# Patient Record
Sex: Male | Born: 1970 | Race: White | Hispanic: No | Marital: Married | Smoking: Never smoker
Health system: Southern US, Community
[De-identification: ages and names within clinical notes are randomized; demographics above are authoritative.]

## PROBLEM LIST (undated history)

## (undated) DIAGNOSIS — Z8249 Family history of ischemic heart disease and other diseases of the circulatory system: Secondary | ICD-10-CM

## (undated) DIAGNOSIS — E785 Hyperlipidemia, unspecified: Secondary | ICD-10-CM

## (undated) DIAGNOSIS — Z87898 Personal history of other specified conditions: Secondary | ICD-10-CM

## (undated) DIAGNOSIS — G935 Compression of brain: Secondary | ICD-10-CM

## (undated) HISTORY — DX: Personal history of other specified conditions: Z87.898

## (undated) HISTORY — DX: Compression of brain: G93.5

## (undated) HISTORY — DX: Family history of ischemic heart disease and other diseases of the circulatory system: Z82.49

## (undated) HISTORY — DX: Hyperlipidemia, unspecified: E78.5

---

## 1995-07-08 HISTORY — PX: OTHER SURGICAL HISTORY: SHX169

## 1997-07-07 HISTORY — PX: HERNIA REPAIR: SHX51

## 2012-11-04 DIAGNOSIS — Z87898 Personal history of other specified conditions: Secondary | ICD-10-CM

## 2012-11-04 HISTORY — DX: Personal history of other specified conditions: Z87.898

## 2012-11-19 ENCOUNTER — Telehealth (HOSPITAL_COMMUNITY): Payer: Self-pay | Admitting: Internal Medicine

## 2012-11-19 NOTE — Telephone Encounter (Signed)
Called patient and left message to notify of ins. Denial and Dr. Blanchie Dessert decision to change to MET. Also notified that a scheduler would be in contact to schedule MET. [name] was notified.  [name]  ----- Message ----- From: [name], MD Sent: 11/19/2012 1:31 PM  To: [name], [facility] Pool Subject: RE: Cardiac CTA Alexander [name] 11/07/1970   [name]- Please let the patient know that the insurance company has denied it and instead recommended higher risk, invasive coronary angiography which I don't think he needs. I would be happy with a MET-TEST. If that were low risk, then it is associated with a low risk of cardiovascular events and it can give [place] good information about general cardiovascular condition. Please share this with him and discuss with the nursing staff or [name] to schedule a MET-TEST (no PFT's) - diagnosis, Chest pain. Thanks. -[place]   ----- Message ----- From: [name] Sent: 11/19/2012 10:49 AM To: [name], MD Subject: Cardiac CTA [name] Sep 14, 1970   Dr. Rennis Golden, As you know, we have had difficulty getting [name]'s cardiac CTA approved with [facility]. Unfortunately, you are unable to proceed with a typical Peer Review and to this I admit fault. When I previously spoke with [employer] they informed me that they did not receive the office notes and EKG I sent via fax. They stated that I may resubmit this information through a process called 'Restart'. Unfortunately I was unaware that this process was the last step available in the form of a Peer Review. Ultimately, upon reviewing the re-faxed information, [employer] upheld their denial decision on the basis that prior testing in the form of a conventional angiography or regular stress test had not been performed. Our options moving forward are as follows: -wait 60 days to request the cardiac CTA again and go through the provider phone review process -recommend alternative testing to evaluate the  patient's indication -the patient may request an appeal in writing through [facility]. This may include a new dictation that you wish to relay. With this option, an appeal determination will be made by [employer] within 30 days I previously notified the patient of the insurance issues we have encountered and I plan to notify him regarding this additional information following your advisement. Thank you and I apologize for this troublesome matter. [name]

## 2012-12-20 ENCOUNTER — Encounter (INDEPENDENT_AMBULATORY_CARE_PROVIDER_SITE_OTHER): Payer: BC Managed Care – PPO

## 2012-12-20 DIAGNOSIS — R0602 Shortness of breath: Secondary | ICD-10-CM

## 2012-12-23 ENCOUNTER — Ambulatory Visit (INDEPENDENT_AMBULATORY_CARE_PROVIDER_SITE_OTHER): Payer: BC Managed Care – PPO | Admitting: Internal Medicine

## 2012-12-23 ENCOUNTER — Encounter: Payer: Self-pay | Admitting: Internal Medicine

## 2012-12-23 VITALS — BP 128/84 | HR 72 | Ht 74.0 in | Wt 183.1 lb

## 2012-12-23 DIAGNOSIS — Z8249 Family history of ischemic heart disease and other diseases of the circulatory system: Secondary | ICD-10-CM

## 2012-12-23 DIAGNOSIS — E785 Hyperlipidemia, unspecified: Secondary | ICD-10-CM

## 2012-12-23 DIAGNOSIS — R0789 Other chest pain: Secondary | ICD-10-CM

## 2012-12-23 NOTE — Patient Instructions (Signed)
Your physician recommends that you schedule a follow-up appointment in: AS NEEDED  

## 2012-12-24 ENCOUNTER — Encounter: Payer: Self-pay | Admitting: Internal Medicine

## 2012-12-24 DIAGNOSIS — Z8249 Family history of ischemic heart disease and other diseases of the circulatory system: Secondary | ICD-10-CM | POA: Insufficient documentation

## 2012-12-24 DIAGNOSIS — E785 Hyperlipidemia, unspecified: Secondary | ICD-10-CM | POA: Insufficient documentation

## 2012-12-24 DIAGNOSIS — R0789 Other chest pain: Secondary | ICD-10-CM | POA: Insufficient documentation

## 2012-12-24 NOTE — Progress Notes (Signed)
OFFICE NOTE  Chief Complaint:  Followup MET-TEST  Primary Care Physician: Ezequiel Kayser, MD  HPI:  Alexander [name] is a 42 year old gentleman who has had complaints of intermittent left chest and left arm pain over the last 2 years. He feels sometimes an anxious feeling in his chest, mostly during rest. Also feels some numbness and tingling in his left arm he notes most when keeping his hand outstretched like on a desk or something of that sort. Sometimes the feeling is somewhat of a burning-type sensation, again more in the left axillary area or sometimes up in the left shoulder and the left arm. There is no substernal location of this and it does not seem to occur during exercise. It is not relieved by rest. In fact, he is quite active, does a lot of exercise, a little bit of elliptical machines, pushups and sit-ups and is really not symptomatic during that exercise. He does, however, have a strong family history of heart disease. In fact, his father died of sudden cardiac death at age 19. He also I understand was recently diagnosed with dyslipidemia. I reviewed his lipoprotein panel as well as his traditional LDL content which demonstrated a high total cholesterol of 231, triglycerides 92, HDL of 40, LDL of 173. His LDL/HDL ratio was 4.3, and according to his total LipoScience profile, his total LDL particle number was 1530, HDL particle number 28, small LDL-P was 339. His LP-IR score was 34. He mentioned you had recommended that this is a genetic pattern of dyslipidemia which I agree and that he may benefit from cholesterol medication; however, he does report less than ideal diet and wants to at least try to work on that some before considering medication. Based on his risk factors, I recommended a cardiac CT today weight coronary calcium and plaque. Unfortunately, I cannot convince the insurance company that this is necessary. Therefore he underwent an alternative test which was metabolic testing.  His midchest reveals good effort with a mildly reduced PO2 in the upper 80s. There was a slight ischemic response, suggesting possible small vessel coronary disease. However the study was considered low risk.  PMHx:  Past Medical History  Diagnosis Date  . Hyperlipidemia   . H/O chest pain May 2014    met test    Past Surgical History  Procedure Laterality Date  . Deviated septal surgery  1997  . Hernia repair  1999    FAMHx:  History reviewed. No pertinent family history.  SOCHx:   reports that he has never smoked. He has quit using smokeless tobacco. His smokeless tobacco use included Snuff. He reports that he does not drink alcohol or use illicit drugs.  ALLERGIES:  No Known Allergies  ROS: A comprehensive review of systems was negative except for: Cardiovascular: positive for dyspnea  HOME MEDS: Current Outpatient Prescriptions  Medication Sig Dispense Refill  . aspirin 81 MG tablet Take 81 mg by mouth daily.      . multivitamin (ONE-A-DAY MEN'S) TABS Take 1 tablet by mouth daily.      . Omega-3 Fatty Acids (FISH OIL PO) Take 1,500 mg by mouth daily.       No current facility-administered medications for this visit.    LABS/IMAGING: No results found for this or any previous visit (from the past 48 hour(s)). No results found.  VITALS: BP 128/84  Pulse 72  Ht 6\' 2"  (1.88 m)  Wt 183 lb 1.6 oz (83.054 kg)  BMI 23.5 kg/m2  EXAM: deferred  EKG: deferred  ASSESSMENT: 1. Low risk cardiometabolic testing 2. Dyslipidemia 3. Family history of coronary artery disease  PLAN: 1.   Alexander [name] has an mildly abnormal cardiometabolic test, however the VO2 is minimally reduced.  This could represent small vessel ischemia. I recommended starting an exercise program along with dietary changes which we discussed at length in the office. I also think despite his changes in diet, he would benefit from statin medication, which can improve microvascular coronary flow. It  would be reasonable to consider repeat metabolic testing in one year. Again it was a pleasure seeing Alexander [name] today. Thank you for the kind referral.    [name], MD, [facility] Attending Cardiologist [facility]  Andreas Sobolewski C 12/24/2012, 8:07 AM

## 2013-04-05 ENCOUNTER — Other Ambulatory Visit (HOSPITAL_COMMUNITY): Payer: Self-pay | Admitting: Internal Medicine

## 2013-04-05 DIAGNOSIS — R0789 Other chest pain: Secondary | ICD-10-CM

## 2013-04-05 DIAGNOSIS — R0602 Shortness of breath: Secondary | ICD-10-CM

## 2013-04-08 ENCOUNTER — Ambulatory Visit (HOSPITAL_COMMUNITY): Payer: BC Managed Care – PPO

## 2013-04-11 ENCOUNTER — Encounter: Payer: Self-pay | Admitting: Internal Medicine

## 2013-04-11 ENCOUNTER — Ambulatory Visit (INDEPENDENT_AMBULATORY_CARE_PROVIDER_SITE_OTHER): Payer: BC Managed Care – PPO | Admitting: Internal Medicine

## 2013-04-11 ENCOUNTER — Ambulatory Visit (HOSPITAL_COMMUNITY): Payer: BC Managed Care – PPO

## 2013-04-11 ENCOUNTER — Other Ambulatory Visit: Payer: Self-pay | Admitting: Internal Medicine

## 2013-04-11 VITALS — BP 110/86 | HR 75 | Ht 74.0 in | Wt 185.2 lb

## 2013-04-11 DIAGNOSIS — Z8249 Family history of ischemic heart disease and other diseases of the circulatory system: Secondary | ICD-10-CM

## 2013-04-11 DIAGNOSIS — R0789 Other chest pain: Secondary | ICD-10-CM

## 2013-04-11 DIAGNOSIS — K219 Gastro-esophageal reflux disease without esophagitis: Secondary | ICD-10-CM | POA: Insufficient documentation

## 2013-04-11 DIAGNOSIS — E785 Hyperlipidemia, unspecified: Secondary | ICD-10-CM

## 2013-04-11 NOTE — Progress Notes (Signed)
OFFICE NOTE  Chief Complaint:  Followup MET-TEST  Primary Care Physician: Alexander Kayser, MD  HPI:  Alexander [name] is a 42 year old gentleman who has had complaints of intermittent left chest and left arm pain over the last 2 years. He feels sometimes an anxious feeling in his chest, mostly during rest. Also feels some numbness and tingling in his left arm he notes most when keeping his hand outstretched like on a desk or something of that sort. Sometimes the feeling is somewhat of a burning-type sensation, again more in the left axillary area or sometimes up in the left shoulder and the left arm. There is no substernal location of this and it does not seem to occur during exercise. It is not relieved by rest. In fact, he is quite active, does a lot of exercise, a little bit of elliptical machines, pushups and sit-ups and is really not symptomatic during that exercise. He does, however, have a strong family history of heart disease. In fact, his father died of sudden cardiac death at age 59. He also I understand was recently diagnosed with dyslipidemia. I reviewed his lipoprotein panel as well as his traditional LDL content which demonstrated a high total cholesterol of 231, triglycerides 92, HDL of 40, LDL of 173. His LDL/HDL ratio was 4.3, and according to his total LipoScience profile, his total LDL particle number was 1530, HDL particle number 28, small LDL-P was 339. His LP-IR score was 34. He mentioned you had recommended that this is a genetic pattern of dyslipidemia which I agree and that he may benefit from cholesterol medication; however, he does report less than ideal diet and wants to at least try to work on that some before considering medication. Based on his risk factors, I recommended a cardiac CT today weight coronary calcium and plaque. Unfortunately, I cannot convince the insurance company that this is necessary. Therefore he underwent an alternative test which was metabolic  testing. His midchest reveals good effort with a mildly reduced PO2 in the upper 80s. There was a slight ischemic response, suggesting possible small vessel coronary disease. However the study was considered low risk.  Alexander [name] continues to have symptoms but feels more like a burning in his left arm left shoulder and upper back. The symptoms are somewhat worse at night when he is laying down trying to go to sleep. He does report that he's been fairly busy at work and eats late at night. He also feels sometimes like he needs to loosen his belt because of some tightness in the abdomen. He does not report any worsening flatus or eructation more than normal. His symptoms are not worse when exerting himself, mowing the lawn or other activities. He recently saw his primary care provider who recommended a CT angiogram however this was denied by the insurance company.  PMHx:  Past Medical History  Diagnosis Date  . Hyperlipidemia   . H/O chest pain May 2014    met test    Past Surgical History  Procedure Laterality Date  . Deviated septal surgery  1997  . Hernia repair  1999    FAMHx:  History reviewed. No pertinent family history.  SOCHx:   reports that he has never smoked. He has quit using smokeless tobacco. His smokeless tobacco use included Snuff. He reports that he does not drink alcohol or use illicit drugs.  ALLERGIES:  No Known Allergies  ROS: A comprehensive review of systems was negative except for: Cardiovascular: positive for  dyspnea Gastrointestinal: positive for reflux symptoms  HOME MEDS: Current Outpatient Prescriptions  Medication Sig Dispense Refill  . aspirin 81 MG tablet Take 81 mg by mouth daily.      . Coenzyme Q10 (COQ10) 200 MG CAPS Take 1 tablet by mouth daily.      . multivitamin (ONE-A-DAY MEN'S) TABS Take 1 tablet by mouth daily.      . Omega-3 Fatty Acids (FISH OIL PO) Take 1,500 mg by mouth daily.      Marland Kitchen atorvastatin (LIPITOR) 40 MG tablet Take 1  tablet by mouth daily.       No current facility-administered medications for this visit.    LABS/IMAGING: No results found for this or any previous visit (from the past 48 hour(s)). No results found.  VITALS: BP 110/86  Pulse 75  Ht 6\' 2"  (1.88 m)  Wt 185 lb 3.2 oz (84.006 kg)  BMI 23.77 kg/m2  EXAM: General appearance: alert and no distress Lungs: clear to auscultation bilaterally Heart: regular rate and rhythm, S1, S2 normal, no murmur, click, rub or gallop Abdomen: soft, non-tender; bowel sounds normal; no masses,  no organomegaly  EKG: Normal sinus rhythm at 75  ASSESSMENT: 1. Low risk cardiometabolic testing - with mild ischemic response 2. Ongoing chest, arm and upper back burning pain - suspicious for GERD 3. Dyslipidemia - now on statin 4. Family history of premature coronary artery disease  PLAN: 1.   Alexander [name] has returned with recurrent symptoms of burning left chest arm and upper back pain. His EKG shows no signs of ischemia at rest. Symptoms are somewhat worse after eating or laying down. He frequently eats late at night after long days at work and feels somewhat short of breath. He also feels that his nose is congested but he does have a history of a deviated nasal septum. At times he feels some pressure in his abdomen like he needs to loosen his belt. I wonder if the symptoms are attributable to acid reflux. Given his lower risk stress test, I would recommend a trial of omeprazole 20 mg daily for 2 weeks. If this is alleviating his symptoms, then no further workup is necessary. If he continues to have symptoms despite this, he may need a coronary assessment as his stress test was mildly abnormal, albeit "low risk". I do feel that a coronary CT angiogram would be a more reasonable approach given his family history and abnormal lipid profile. He is not interested in invasive approach such as cardiac catheterization. A plan to see him back in one month.    [name], MD, [facility] Attending Cardiologist [facility]  HILTY,Kenneth C 04/11/2013, 10:34 AM

## 2013-04-11 NOTE — Patient Instructions (Addendum)
Your physician has recommended you make the following change in your medication: get some over the counter omeprazole (prilosec) 20 mg. Take once daily.  Your physician recommends that you schedule a follow-up appointment in: 1 MONTH

## 2013-04-15 ENCOUNTER — Other Ambulatory Visit: Payer: Self-pay | Admitting: Internal Medicine

## 2013-04-15 DIAGNOSIS — I200 Unstable angina: Secondary | ICD-10-CM

## 2013-04-15 NOTE — Progress Notes (Signed)
Alexander [name] has recurrent symptoms of burning left chest arm and upper back pain. I saw in the office only a few days ago and recommended a trial of omeprazole to see if it improves his symptoms. He has not started that medication but was admitted again to his primary care doctor's office today with worsening chest pain. The episode is coming more frequently and more severely. As was previously noted, he had a mildly abnormal metabolic stress test which showed a mild ischemic response. His symptoms now seem to be more consistent with an accelerating pattern of worsening intensity and increased frequency concerning for unstable angina. There are least CCS 2 anginal symptoms.  I do feel that a coronary evaluation is warranted. I believe a lower risk anatomic study such as a CT coronary angiogram would be a more reasonable approach given his family history and abnormal lipid profile. He is not interested in invasive approach such as cardiac catheterization and I'm not sure is enough definitive evidence to proceed directly to this.  If his coronary angiogram had no coronary calcium and no soft plaques, this would be very reassuring and have a high negative predictive value. It would then be reasonable to pursue other possible causes of his symptoms. I discussed this today with Dr. Waynard Edwards over the phone and he is in agreement with this approach.  We'll try to schedule his CT angiogram next week after obtaining prior authorization from his insurance company.  [name], MD, [facility] Attending Cardiologist [facility]  [name],Alexander C 04/15/2013, 12:02 PM

## 2013-04-29 ENCOUNTER — Telehealth: Payer: Self-pay | Admitting: *Deleted

## 2013-04-29 NOTE — Telephone Encounter (Signed)
Mr. Carlisi called and was wondering if Dr. Rennis Golden had an update on his CT scan being approved. He stated that Dr. Rennis Golden needed to call the insurance company and he was going to let the pt know the status.

## 2013-04-29 NOTE — Telephone Encounter (Signed)
Please advise - pre-approval from insurance co. neded for CTA?

## 2013-05-02 NOTE — Telephone Encounter (Signed)
All the patient's we've ordered this procedure for are done at [facility]

## 2013-05-02 NOTE — Telephone Encounter (Signed)
Insurance won't approve CTA - I think he needs a cath then.  I left him a message today. He will likely call back tomorrow.  I'd be happy to speak with him if you let me know.  -Dr. Rennis Golden

## 2013-05-02 NOTE — Telephone Encounter (Signed)
This test does require pre-approval from [facility]. I'll get started on it today and let you and Mr. Shearer know the insurance company's determination within 3 business days. Do you happen to know where this is being scheduled?   Thanks,  [name]

## 2013-05-03 NOTE — Telephone Encounter (Signed)
Called Alexander [name] to discuss cath - he is hesitant and wants to discuss further with his wife before proceeding. I think we need to exclude angina before we can look for other possible causes - he has been endorsing more dyspnea recently which is unusual for him.

## 2013-05-03 NOTE — Telephone Encounter (Signed)
Patient called returning Dr. Blanchie Dessert call from 10/27. Informed patient that Dr. Rennis Golden stated the CTA will not get approved from insurance but that he recommends a cath - patient stated he would like to talk to Dr. Rennis Golden about this, as "things have changed" and he isn't sure he wants to proceed with a cardiac cath right now. Informed patient Dr. Rennis Golden would be notified that he called and that he please call him back after 2pm at [phone].

## 2013-05-05 ENCOUNTER — Other Ambulatory Visit: Payer: Self-pay | Admitting: *Deleted

## 2013-05-05 ENCOUNTER — Telehealth (HOSPITAL_COMMUNITY): Payer: Self-pay | Admitting: *Deleted

## 2013-05-05 DIAGNOSIS — R6889 Other general symptoms and signs: Secondary | ICD-10-CM

## 2013-05-05 DIAGNOSIS — R079 Chest pain, unspecified: Secondary | ICD-10-CM

## 2013-05-11 ENCOUNTER — Ambulatory Visit (HOSPITAL_COMMUNITY)
Admission: RE | Admit: 2013-05-11 | Discharge: 2013-05-11 | Disposition: A | Discharge status: Home / Self Care | Payer: BC Managed Care – PPO | Source: Ambulatory Visit | Attending: Cardiovascular Disease | Admitting: Cardiovascular Disease

## 2013-05-11 DIAGNOSIS — R002 Palpitations: Secondary | ICD-10-CM | POA: Insufficient documentation

## 2013-05-11 DIAGNOSIS — R5381 Other malaise: Secondary | ICD-10-CM | POA: Insufficient documentation

## 2013-05-11 DIAGNOSIS — R209 Unspecified disturbances of skin sensation: Secondary | ICD-10-CM | POA: Insufficient documentation

## 2013-05-11 DIAGNOSIS — R079 Chest pain, unspecified: Secondary | ICD-10-CM | POA: Insufficient documentation

## 2013-05-11 DIAGNOSIS — R6889 Other general symptoms and signs: Secondary | ICD-10-CM

## 2013-05-11 MED ORDER — TECHNETIUM TC 99M SESTAMIBI GENERIC - CARDIOLITE
30.0000 | Freq: Once | INTRAVENOUS | Status: AC | PRN
  Administered 2013-05-11: 30 via INTRAVENOUS

## 2013-05-11 MED ORDER — TECHNETIUM TC 99M SESTAMIBI GENERIC - CARDIOLITE
10.0000 | Freq: Once | INTRAVENOUS | Status: AC | PRN
  Administered 2013-05-11: 10 via INTRAVENOUS

## 2013-05-11 NOTE — Procedures (Addendum)
[facility] [address] [place] [address] [phone]  Cardiology Nuclear Med Study  Alexander [name] is a 42 y.o. male     MRN : [id number]     DOB: 1971/01/14  Procedure Date: 05/11/2013  Nuclear Med Background Indication for Stress Test:  Evaluation for Ischemia History:  No prior history reported. Cardiac Risk Factors: Family History - CAD and Lipids  Symptoms:  Chest Pain, Fatigue, Palpitations, SOB and Left arm numbness and tingling sensation reported.   Nuclear Pre-Procedure Caffeine/Decaff Intake:  7:00pm NPO After: 5:00am   IV Site: R Hand  IV 0.9% NS with Angio Cath:  22g  Chest Size (in):  42"  IV Started by: Emmit Pomfret, RN  Height: 6\' 2"  (1.88 m)  Cup Size: n/a  BMI:  Body mass index is 23.74 kg/(m^2). Weight:  185 lb (83.915 kg)   Tech Comments:  n/a    Nuclear Med Study 1 or 2 day study: 1 day  Stress Test Type:  Stress  Order Authorizing Provider:  Zoila Shutter, MD   Resting Radionuclide: Technetium 32m Sestamibi  Resting Radionuclide Dose: 10.8 mCi   Stress Radionuclide:  Technetium 39m Sestamibi  Stress Radionuclide Dose: 30.4 mCi           Stress Protocol Rest HR: 75 Stress HR: 169  Rest BP: 141/101 Stress BP: 172/107  Exercise Time (min): 11:15 METS: 13.4   Predicted Max HR: 177 bpm % Max HR: 95.48 bpm Rate Pressure Product: 84696  Dose of Adenosine (mg):  n/a Dose of Lexiscan: n/a mg  Dose of Atropine (mg): n/a Dose of Dobutamine: n/a mcg/kg/min (at max HR)  Stress Test Technologist: Esperanza Sheets, CCT Nuclear Technologist: Koren Shiver, CNMT   Rest Procedure:  Myocardial perfusion imaging was performed at rest 45 minutes following the intravenous administration of Technetium 18m Sestamibi. Stress Procedure:  The patient performed treadmill exercise using a Bruce  Protocol for 11:15 minutes. The patient stopped due to Fatigue and denied any chest pain.  There were no significant  ST-T wave changes.  Technetium 49m Sestamibi was injected at peak exercise and myocardial perfusion imaging was performed after a brief delay.  Transient Ischemic Dilatation (Normal <1.22):  0.83 Lung/Heart Ratio (Normal <0.45):  0.28 QGS EDV:  89 ml QGS ESV:  27 ml LV Ejection Fraction: 69%  Signed by      Rest ECG: NSR - Normal EKG  Stress ECG: No significant change from baseline ECG  QPS Raw Data Images:  Normal; no motion artifact; normal heart/lung ratio. Stress Images:  Normal homogeneous uptake in all areas of the myocardium. Rest Images:  Normal homogeneous uptake in all areas of the myocardium. Subtraction (SDS):  No evidence of ischemia.  Impression Exercise Capacity:  Excellent exercise capacity. BP Response:  Normal blood pressure response. Clinical Symptoms:  No significant symptoms noted. ECG Impression:  No significant ST segment change suggestive of ischemia. Comparison with Prior Nuclear Study: No previous nuclear study performed  Overall Impression:  Normal stress nuclear study.  LV Wall Motion:  NL LV Function; NL Wall Motion   Runell Gess, MD  05/11/2013 1:46 PM

## 2013-05-12 ENCOUNTER — Encounter: Payer: Self-pay | Admitting: *Deleted

## 2013-05-13 ENCOUNTER — Ambulatory Visit (INDEPENDENT_AMBULATORY_CARE_PROVIDER_SITE_OTHER): Payer: BC Managed Care – PPO | Admitting: Internal Medicine

## 2013-05-13 ENCOUNTER — Encounter: Payer: Self-pay | Admitting: Internal Medicine

## 2013-05-13 VITALS — BP 144/94 | HR 74 | Ht 74.0 in | Wt 181.7 lb

## 2013-05-13 DIAGNOSIS — F411 Generalized anxiety disorder: Secondary | ICD-10-CM

## 2013-05-13 DIAGNOSIS — F419 Anxiety disorder, unspecified: Secondary | ICD-10-CM

## 2013-05-13 DIAGNOSIS — R0789 Other chest pain: Secondary | ICD-10-CM

## 2013-05-13 NOTE — Progress Notes (Signed)
OFFICE NOTE  Chief Complaint:  Followup MET-TEST  Primary Care Physician: Ezequiel Kayser, MD  HPI:  Alexander [name] is a 42 year old gentleman who has had complaints of intermittent left chest and left arm pain over the last 2 years. He feels sometimes an anxious feeling in his chest, mostly during rest. Also feels some numbness and tingling in his left arm he notes most when keeping his hand outstretched like on a desk or something of that sort. Sometimes the feeling is somewhat of a burning-type sensation, again more in the left axillary area or sometimes up in the left shoulder and the left arm. There is no substernal location of this and it does not seem to occur during exercise. It is not relieved by rest. In fact, he is quite active, does a lot of exercise, a little bit of elliptical machines, pushups and sit-ups and is really not symptomatic during that exercise. He does, however, have a strong family history of heart disease. In fact, his father died of sudden cardiac death at age 48. He also I understand was recently diagnosed with dyslipidemia. I reviewed his lipoprotein panel as well as his traditional LDL content which demonstrated a high total cholesterol of 231, triglycerides 92, HDL of 40, LDL of 173. His LDL/HDL ratio was 4.3, and according to his total [employer] profile, his total LDL particle number was 1530, HDL particle number 28, small LDL-P was 339. His LP-IR score was 34. He mentioned you had recommended that this is a genetic pattern of dyslipidemia which I agree and that he may benefit from cholesterol medication; however, he does report less than ideal diet and wants to at least try to work on that some before considering medication. Based on his risk factors, I recommended a cardiac CT today weight coronary calcium and plaque. Unfortunately, I cannot convince the insurance company that this is necessary. Therefore he underwent an alternative test which was metabolic  testing. His midchest reveals good effort with a mildly reduced PO2 in the upper 80s. There was a slight ischemic response, suggesting possible small vessel coronary disease. However the study was considered low risk.  [name] continues to have symptoms but feels more like a burning in his left arm left shoulder and upper back. The symptoms are somewhat worse at night when he is laying down trying to go to sleep. He does report that he's been fairly busy at work and eats late at night. He also feels sometimes like he needs to loosen his belt because of some tightness in the abdomen. He does not report any worsening flatus or eructation more than normal. His symptoms are not worse when exerting himself, mowing the lawn or other activities. He recently saw his primary care provider who recommended a CT angiogram however this was denied by the insurance company.  Based on our inability to undergo CT angiography due to insurance company denial, I offered further testing including stress testing or cardiac catheterization. He is quite leery of the risk of cardiac catheterization, which is low, but opted for an exercise nuclear stress test. He underwent a stress test on 05/11/2013. The nuclear stress test was normal.  PMHx:  Past Medical History  Diagnosis Date  . Hyperlipidemia   . H/O chest pain May 2014    met test  . Family history of premature CAD     Past Surgical History  Procedure Laterality Date  . Deviated septal surgery  1997  . Hernia repair  1999    FAMHx:  Family History  Problem Relation Age of Onset  . Sudden death Father     sudeen cardiac death in 17s  . Cancer Mother     abdominal ca  . Heart attack Maternal Grandfather     SOCHx:   reports that he has never smoked. He has quit using smokeless tobacco. His smokeless tobacco use included Snuff. He reports that he does not drink alcohol or use illicit drugs.  ALLERGIES:  No Known Allergies  ROS: A comprehensive  review of systems was negative except for: Cardiovascular: positive for dyspnea Gastrointestinal: positive for reflux symptoms  HOME MEDS: Current Outpatient Prescriptions  Medication Sig Dispense Refill  . aspirin 81 MG tablet Take 81 mg by mouth daily.      Marland Kitchen atorvastatin (LIPITOR) 40 MG tablet Take 1 tablet by mouth daily.      . Coenzyme Q10 (COQ10) 200 MG CAPS Take 1 tablet by mouth daily.      . multivitamin (ONE-A-DAY MEN'S) TABS Take 1 tablet by mouth daily.      . Omega-3 Fatty Acids (FISH OIL PO) Take 1,500 mg by mouth daily.       No current facility-administered medications for this visit.    LABS/IMAGING: No results found for this or any previous visit (from the past 48 hour(s)). No results found.  VITALS: BP 144/94  Pulse 74  Ht 6\' 2"  (1.88 m)  Wt 181 lb 11.2 oz (82.419 kg)  BMI 23.32 kg/m2  EXAM: deferred  EKG: deferred  ASSESSMENT: 1. Low risk cardiac metabolic stress testing and nuclear stress test 2. Atypical chest pain-likely related to anxiety 3. Dyslipidemia - now on statin 4. Family history of premature coronary artery disease  PLAN: 1.   Mr. Scholle has now had a negative nuclear stress test in addition to a low-risk cardio metabolic stress test.  I do feel that his chest pain is very atypical and most likely noncardiac in nature. No further coronary testing is indicated at this time. He has reported some very mild awareness of his breathing which he refers to as dyspnea. He says this does get slightly better with low-dose lorazepam which does help him sleep at night. I suspect his symptoms are attributable to underlying anxiety, which hopefully can manage with better awareness, possible cognitive behavioral therapy and/or medication. Follow up with me on an as-needed basis.  Thanks again for allowing me to participate in his care.    [name], MD, [facility] Attending Cardiologist [facility]  Blaize Epple  C 05/13/2013, 4:02 PM

## 2013-05-13 NOTE — Patient Instructions (Signed)
Your physician recommends that you schedule a follow-up appointment as needed  

## 2013-06-08 ENCOUNTER — Telehealth: Payer: Self-pay | Admitting: Internal Medicine

## 2013-06-08 NOTE — Telephone Encounter (Signed)
An order was placed for a Coronary CTA for this patient. Dr. Rennis Golden performed a peer review with [employer]. [employer] would not authorize this test; Dr. Rennis Golden withdrew the request with [employer]. The patient was notified on 05/02/13 that his insurance would not cover a Coronary CTA and the appointment was cancelled.

## 2015-02-07 ENCOUNTER — Other Ambulatory Visit: Payer: Self-pay | Admitting: Internal Medicine

## 2015-02-07 ENCOUNTER — Encounter: Payer: Self-pay | Admitting: Physician Assistant

## 2015-02-07 DIAGNOSIS — R1013 Epigastric pain: Secondary | ICD-10-CM

## 2015-02-09 ENCOUNTER — Ambulatory Visit
Admission: RE | Admit: 2015-02-09 | Discharge: 2015-02-09 | Disposition: A | Discharge status: Home / Self Care | Payer: BLUE CROSS/BLUE SHIELD | Source: Ambulatory Visit | Attending: Internal Medicine | Admitting: Internal Medicine

## 2015-02-09 DIAGNOSIS — R1013 Epigastric pain: Secondary | ICD-10-CM

## 2015-02-13 ENCOUNTER — Encounter: Payer: Self-pay | Admitting: Physician Assistant

## 2015-02-13 ENCOUNTER — Ambulatory Visit (INDEPENDENT_AMBULATORY_CARE_PROVIDER_SITE_OTHER): Payer: BLUE CROSS/BLUE SHIELD | Admitting: Physician Assistant

## 2015-02-13 VITALS — BP 114/70 | HR 63 | Ht 74.0 in | Wt 184.0 lb

## 2015-02-13 DIAGNOSIS — R12 Heartburn: Secondary | ICD-10-CM | POA: Diagnosis not present

## 2015-02-13 DIAGNOSIS — K219 Gastro-esophageal reflux disease without esophagitis: Secondary | ICD-10-CM | POA: Diagnosis not present

## 2015-02-13 MED ORDER — PANTOPRAZOLE SODIUM 40 MG PO TBEC: DELAYED_RELEASE_TABLET | ORAL | Status: DC

## 2015-02-13 NOTE — Patient Instructions (Signed)
We sent a  prescription to [facility], [name] 1. Pantoprazole sodium 40 mg  You have been scheduled for an endoscopy. Please follow written instructions given to you at your visit today. If you use inhalers (even only as needed), please bring them with you on the day of your procedure. Your physician has requested that you go to www.startemmi.com and enter the access code given to you at your visit today. This web site gives a general overview about your procedure. However, you should still follow specific instructions given to you by our office regarding your preparation for the procedure.

## 2015-02-13 NOTE — Progress Notes (Signed)
Patient ID: Alexander [name], male   DOB: 1970-08-31, 44 y.o.   MRN: [id number]   Subjective:    Patient ID: Alexander [name], male    DOB: Jan 22, 1971, 44 y.o.   MRN: [id number]  HPI [name]  Is a pleasant 44 year old white male new to GI today referred by Dr. Sherrye Payor for evaluation of subxiphoid discomfort and burning chest discomfort. Patient had undergone upper abdominal ultrasound on 02/09/2015 which was normal He says he has had his current symptoms over the past 2 years with gradual increase. He wonders if some of it may be related to stress because he was on vacation last week and says he noticed some significant decrease in symptoms.  He has tried over-the-counter Prilosec and over-the-counter Nexium each for a 14 day course but didn't notice any significant change in symptoms. Is not been on anything recently. Rolaids generally not beneficial. He complains of a pressure sensation in the subxiphoid area when he wakes up in the morning sometimes associated with nausea or after a large meal may have a similar sensation. He also complains of a burning feeling in his esophagus which at this point is fairly constant. Says this used to be just with meals but has become more constant with time. Seems to be worse with sitting or bending over. He has no dysphagia complaints though he has a sensation of "a narrowed" feeling. His appetite is been good and his weight has been stable. No lower GI complaints, specifically no changes in bowel habits melena or hematochezia. Family history negative for GI disease and colon cancer.  Review of Systems Pertinent positive and negative review of systems were noted in the above HPI section.  All other review of systems was otherwise negative.  Outpatient Encounter Prescriptions as of 02/13/2015  Medication Sig  . aspirin 81 MG tablet Take 81 mg by mouth daily.  Marland Kitchen atorvastatin (LIPITOR) 40 MG tablet Take 1 tablet by mouth daily.  . Coenzyme Q10 (COQ10) 200 MG CAPS Take 1  tablet by mouth daily.  . multivitamin (ONE-A-DAY MEN'S) TABS Take 1 tablet by mouth daily.  . pantoprazole (PROTONIX) 40 MG tablet Take 1 tab with breakfast and dinner.  . [DISCONTINUED] aspirin 81 MG tablet Take 81 mg by mouth daily.  . [DISCONTINUED] Omega-3 Fatty Acids (FISH OIL PO) Take 1,500 mg by mouth daily.   No facility-administered encounter medications on file as of 02/13/2015.   No Known Allergies Patient Active Problem List   Diagnosis Date Noted  . Anxiety 05/13/2013  . GERD (gastroesophageal reflux disease) 04/11/2013  . Dyslipidemia 12/24/2012  . Family history of premature CAD 12/24/2012  . Atypical chest pain 12/24/2012   History   Social History  . Marital Status: Married    Spouse Name: N/A  . Number of Children: 0  . Years of Education: N/A   Occupational History  . business owner    Social History Main Topics  . Smoking status: Never Smoker   . Smokeless tobacco: Former Neurosurgeon    Types: Snuff     Comment: dip - 25 years ago  . Alcohol Use: No  . Drug Use: No  . Sexual Activity: Not on file   Other Topics Concern  . Not on file   Social History Narrative    Alexander [name] family history includes Cancer in his mother; Heart attack in his maternal grandfather; Sudden death in his father.      Objective:    Filed Vitals:   02/13/15 0841  BP:  114/70  Pulse: 63    Physical Exam   Well-developed white male in no acute distress, pleasant blood pressure 114/70 pulse 63 height 6 foot 2 weight 184. HEENT; nontraumatic normocephalic EOMI PERRLA sclera anicteric, Supple; no JVD, Cardiovascular; regular rate and rhythm with S1-S2 no murmur or gallop, Pulmonary ;clear bilaterally , Abdomen ;soft, nontender, nondistended, no palpable mass or hepatosplenomegaly bowel sounds are present , Rectal; exam not done, Neuropsych; mood and affect appropriate       Assessment & Plan:   #1 44 yo male with 2 year hx of gradual progression of subxyphoid discomfort  and esophageal burning Most consistent with GERD, r/o Eosinophilic esophagitis( no response to short course of PPI ) #2 anxiety  Plan; Start trial of high dose PPI short term to see if alleviates sxs- Protonix 40 mg po BID Antireflux regimen  Schedule for EGD with Dr Arlyce Dice- benefits,risks , and alternatives discussed in detail with pt and he is agreeable to proceed.    Emmagene Ortner S Brithney Bensen PA-C 02/13/2015   Cc: Rodrigo Ran, MD

## 2015-02-14 NOTE — Progress Notes (Signed)
Reviewed and agree with management. Robert D. Kaplan, M.D., FACG  

## 2015-04-16 ENCOUNTER — Encounter: Payer: BLUE CROSS/BLUE SHIELD | Admitting: Gastroenterology

## 2016-02-12 DIAGNOSIS — Z8249 Family history of ischemic heart disease and other diseases of the circulatory system: Secondary | ICD-10-CM | POA: Diagnosis not present

## 2016-02-12 DIAGNOSIS — Z0000 Encounter for general adult medical examination without abnormal findings: Secondary | ICD-10-CM | POA: Diagnosis not present

## 2016-02-12 DIAGNOSIS — E785 Hyperlipidemia, unspecified: Secondary | ICD-10-CM | POA: Diagnosis not present

## 2016-02-12 DIAGNOSIS — Z125 Encounter for screening for malignant neoplasm of prostate: Secondary | ICD-10-CM | POA: Diagnosis not present

## 2016-02-19 DIAGNOSIS — Z0000 Encounter for general adult medical examination without abnormal findings: Secondary | ICD-10-CM | POA: Diagnosis not present

## 2016-02-19 DIAGNOSIS — Z6824 Body mass index (BMI) 24.0-24.9, adult: Secondary | ICD-10-CM | POA: Diagnosis not present

## 2016-02-19 DIAGNOSIS — R7301 Impaired fasting glucose: Secondary | ICD-10-CM | POA: Diagnosis not present

## 2016-02-19 DIAGNOSIS — Z1389 Encounter for screening for other disorder: Secondary | ICD-10-CM | POA: Diagnosis not present

## 2016-02-19 DIAGNOSIS — J301 Allergic rhinitis due to pollen: Secondary | ICD-10-CM | POA: Diagnosis not present

## 2016-02-19 DIAGNOSIS — R070 Pain in throat: Secondary | ICD-10-CM | POA: Diagnosis not present

## 2017-01-19 DIAGNOSIS — J209 Acute bronchitis, unspecified: Secondary | ICD-10-CM | POA: Diagnosis not present

## 2017-01-19 DIAGNOSIS — R0602 Shortness of breath: Secondary | ICD-10-CM | POA: Diagnosis not present

## 2017-01-19 DIAGNOSIS — R05 Cough: Secondary | ICD-10-CM | POA: Diagnosis not present

## 2017-01-19 DIAGNOSIS — Z6824 Body mass index (BMI) 24.0-24.9, adult: Secondary | ICD-10-CM | POA: Diagnosis not present

## 2017-04-15 DIAGNOSIS — E7849 Other hyperlipidemia: Secondary | ICD-10-CM | POA: Diagnosis not present

## 2017-04-15 DIAGNOSIS — Z125 Encounter for screening for malignant neoplasm of prostate: Secondary | ICD-10-CM | POA: Diagnosis not present

## 2017-04-15 DIAGNOSIS — E785 Hyperlipidemia, unspecified: Secondary | ICD-10-CM | POA: Diagnosis not present

## 2017-04-15 DIAGNOSIS — R7301 Impaired fasting glucose: Secondary | ICD-10-CM | POA: Diagnosis not present

## 2017-04-20 DIAGNOSIS — J3089 Other allergic rhinitis: Secondary | ICD-10-CM | POA: Diagnosis not present

## 2017-04-20 DIAGNOSIS — F418 Other specified anxiety disorders: Secondary | ICD-10-CM | POA: Diagnosis not present

## 2017-04-20 DIAGNOSIS — Z6824 Body mass index (BMI) 24.0-24.9, adult: Secondary | ICD-10-CM | POA: Diagnosis not present

## 2017-04-20 DIAGNOSIS — Z23 Encounter for immunization: Secondary | ICD-10-CM | POA: Diagnosis not present

## 2017-04-20 DIAGNOSIS — R7301 Impaired fasting glucose: Secondary | ICD-10-CM | POA: Diagnosis not present

## 2017-04-20 DIAGNOSIS — Z1389 Encounter for screening for other disorder: Secondary | ICD-10-CM | POA: Diagnosis not present

## 2017-04-20 DIAGNOSIS — E7849 Other hyperlipidemia: Secondary | ICD-10-CM | POA: Diagnosis not present

## 2017-04-20 DIAGNOSIS — Z0000 Encounter for general adult medical examination without abnormal findings: Secondary | ICD-10-CM | POA: Diagnosis not present

## 2017-04-20 DIAGNOSIS — R05 Cough: Secondary | ICD-10-CM | POA: Diagnosis not present

## 2017-05-07 DIAGNOSIS — Z6824 Body mass index (BMI) 24.0-24.9, adult: Secondary | ICD-10-CM | POA: Diagnosis not present

## 2017-05-07 DIAGNOSIS — H8110 Benign paroxysmal vertigo, unspecified ear: Secondary | ICD-10-CM | POA: Diagnosis not present

## 2018-03-11 ENCOUNTER — Emergency Department (HOSPITAL_COMMUNITY)
Admission: EM | Admit: 2018-03-11 | Discharge: 2018-03-11 | Disposition: A | Discharge status: Home / Self Care | Payer: BLUE CROSS/BLUE SHIELD | Attending: Emergency Medicine | Admitting: Emergency Medicine

## 2018-03-11 ENCOUNTER — Encounter (HOSPITAL_COMMUNITY): Payer: Self-pay | Admitting: *Deleted

## 2018-03-11 ENCOUNTER — Emergency Department (HOSPITAL_COMMUNITY): Payer: BLUE CROSS/BLUE SHIELD

## 2018-03-11 DIAGNOSIS — R3129 Other microscopic hematuria: Secondary | ICD-10-CM | POA: Insufficient documentation

## 2018-03-11 DIAGNOSIS — R109 Unspecified abdominal pain: Secondary | ICD-10-CM

## 2018-03-11 DIAGNOSIS — R1011 Right upper quadrant pain: Secondary | ICD-10-CM | POA: Diagnosis not present

## 2018-03-11 DIAGNOSIS — Z79899 Other long term (current) drug therapy: Secondary | ICD-10-CM | POA: Diagnosis not present

## 2018-03-11 LAB — CBC WITH DIFFERENTIAL/PLATELET
Basophils Absolute: 0 10*3/uL (ref 0.0–0.1)
Basophils Relative: 0 %
Eosinophils Absolute: 0.1 10*3/uL (ref 0.0–0.7)
Eosinophils Relative: 1 %
HCT: 42 % (ref 39.0–52.0)
Hemoglobin: 14.6 g/dL (ref 13.0–17.0)
Lymphocytes Relative: 9 %
Lymphs Abs: 0.6 10*3/uL — ABNORMAL LOW (ref 0.7–4.0)
MCH: 31.3 pg (ref 26.0–34.0)
MCHC: 34.8 g/dL (ref 30.0–36.0)
MCV: 89.9 fL (ref 78.0–100.0)
Monocytes Absolute: 0.4 10*3/uL (ref 0.1–1.0)
Monocytes Relative: 6 %
Neutro Abs: 5.8 10*3/uL (ref 1.7–7.7)
Neutrophils Relative %: 84 %
Platelets: 173 10*3/uL (ref 150–400)
RBC: 4.67 MIL/uL (ref 4.22–5.81)
RDW: 12.2 % (ref 11.5–15.5)
WBC: 7 10*3/uL (ref 4.0–10.5)

## 2018-03-11 LAB — COMPREHENSIVE METABOLIC PANEL
ALT: 37 U/L (ref 0–44)
AST: 34 U/L (ref 15–41)
Albumin: 4.4 g/dL (ref 3.5–5.0)
Alkaline Phosphatase: 68 U/L (ref 38–126)
Anion gap: 11 (ref 5–15)
BUN: 15 mg/dL (ref 6–20)
CO2: 29 mmol/L (ref 22–32)
Calcium: 9.4 mg/dL (ref 8.9–10.3)
Chloride: 102 mmol/L (ref 98–111)
Creatinine, Ser: 1.02 mg/dL (ref 0.61–1.24)
GFR calc Af Amer: >60 mL/min (ref >60)
GFR calc non Af Amer: >60 mL/min (ref >60)
Glucose, Bld: 144 mg/dL — ABNORMAL HIGH (ref 70–99)
Potassium: 4.6 mmol/L (ref 3.5–5.1)
Sodium: 142 mmol/L (ref 135–145)
Total Bilirubin: 1.4 mg/dL — ABNORMAL HIGH (ref 0.3–1.2)
Total Protein: 7.2 g/dL (ref 6.5–8.1)

## 2018-03-11 LAB — LIPASE, BLOOD: Lipase: 32 U/L (ref 11–51)

## 2018-03-11 LAB — URINALYSIS, ROUTINE W REFLEX MICROSCOPIC
Bilirubin Urine: NEGATIVE
Glucose, UA: NEGATIVE mg/dL
Ketones, ur: 5 mg/dL — AB
Leukocytes, UA: NEGATIVE
Nitrite: NEGATIVE
Protein, ur: 30 mg/dL — AB
RBC / HPF: >50 RBC/hpf — ABNORMAL HIGH (ref 0–5)
Specific Gravity, Urine: 1.023 (ref 1.005–1.030)
pH: 5 (ref 5.0–8.0)

## 2018-03-11 MED ORDER — SODIUM CHLORIDE 0.9 % IV BOLUS: 1000.0000 mL | Freq: Once | INTRAVENOUS | Status: DC

## 2018-03-11 NOTE — Discharge Instructions (Signed)
Alternate 600 mg of ibuprofen and [phone] mg of Tylenol every 3 hours as needed for pain. Do not exceed 4000 mg of Tylenol daily.  Take with food to avoid upset stomach issues.  Drink plenty of water and get plenty of rest.  Follow-up with your PCP or urology for reevaluation of the blood in your urine.  Return to emergency department if any concerning signs or symptoms develop such as uncontrolled pain, fevers, or persistent vomiting.

## 2018-03-11 NOTE — ED Provider Notes (Signed)
[facility] DEPT Provider Note   CSN: [id number] Arrival date & time: 03/11/18  0725     History   Chief Complaint Chief Complaint  Patient presents with  . Groin Pain  . Flank Pain    HPI Alexander [name] is a 47 y.o. male with history of hyperlipidemia, anxiety, GERD presents for evaluation of acute onset, resolved episode of right flank pain.  He states that at around 5:45 AM this morning he went to urinate and shortly thereafter felt sudden onset sharp stabbing throbbing pain to the right flank radiating to the right lower quadrant.  Pain was constant, worsened with bumps in the road while in the car on route to the ED.  He also noted associated nausea and dysuria.  Denies hematuria, urgency, or frequency.  Denies vomiting, diarrhea, constipation, chest pain, or shortness of breath.  No fevers or chills.  No medications prior to arrival.  He states that in the waiting room the pain began to radiate to the right groin but he denies any testicular pain or swelling at this time.  He states that a few minutes prior to my assessment the pain began to improve and he is now essentially pain-free.  The history is provided by the patient.    Past Medical History:  Diagnosis Date  . Family history of premature CAD   . H/O chest pain May 2014   met test  . 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 Hyperlipidemia     Patient Active Problem List   Diagnosis Date Noted  . Anxiety 05/13/2013  . GERD (gastroesophageal reflux disease) 04/11/2013  . Dyslipidemia 12/24/2012  . Family history of premature CAD 12/24/2012  . Atypical chest pain 12/24/2012    Past Surgical History:  Procedure Laterality Date  . deviated septal surgery  1997  . HERNIA REPAIR  1999        Home Medications    Prior to Admission medications   Medication Sig Start Date End Date Taking? Authorizing Provider  atorvastatin (LIPITOR) 40 MG tablet Take 1 tablet by mouth daily. 04/08/13  Yes  [provider]  Coenzyme Q10 (COQ10) 200 MG CAPS Take 1 tablet by mouth daily.   Yes [provider]  pantoprazole (PROTONIX) 40 MG tablet Take 1 tab with breakfast and dinner. Patient not taking: Reported on 03/11/2018 02/13/15   Alfredia Ferguson, PA-C    Family History Family History  Problem Relation Age of Onset  . Sudden death Father        sudeen cardiac death in 30s  . Cancer Mother        abdominal ca  . Heart attack Maternal Grandfather     Social History Social History   Tobacco Use  . Smoking status: Never Smoker  . Smokeless tobacco: Former Systems developer    Types: Snuff  . Tobacco comment: dip - 25 years ago  Substance Use Topics  . Alcohol use: No    Alcohol/week: 0.0 standard drinks  . Drug use: No     Allergies   Patient has no known allergies.   Review of Systems Review of Systems  Constitutional: Negative for chills and fever.  Respiratory: Negative for shortness of breath.   Cardiovascular: Negative for chest pain.  Gastrointestinal: Positive for abdominal pain and nausea. Negative for constipation, diarrhea and vomiting.  Genitourinary: Positive for dysuria, flank pain and testicular pain (resolved). Negative for hematuria and scrotal swelling.  All other systems reviewed and are negative.  Physical Exam Updated Vital Signs BP 117/76   Pulse 66   Temp 97.7 F (36.5 C) (Oral)   Resp 18   SpO2 99%   Physical Exam  Constitutional: He appears well-developed and well-nourished. No distress.  Resting comfortably in bed  HENT:  Head: Normocephalic and atraumatic.  Eyes: Conjunctivae are normal. Right eye exhibits no discharge. Left eye exhibits no discharge.  Neck: No JVD present. No tracheal deviation present.  Cardiovascular: Normal rate, regular rhythm and normal heart sounds.  Pulmonary/Chest: Effort normal and breath sounds normal.  Abdominal: Soft. Bowel sounds are normal. He exhibits no distension. There is no tenderness.  There is no rigidity, no rebound, no guarding and no CVA tenderness.  Musculoskeletal: He exhibits no edema.  Neurological: He is alert.  Skin: Skin is warm and dry. No erythema.  Psychiatric: He has a normal mood and affect. His behavior is normal.  Nursing note and vitals reviewed.    ED Treatments / Results  Labs (all labs ordered are listed, but only abnormal results are displayed) Labs Reviewed  CBC WITH DIFFERENTIAL/PLATELET - Abnormal; Notable for the following components:      Result Value   Lymphs Abs 0.6 (*)    All other components within normal limits  COMPREHENSIVE METABOLIC PANEL - Abnormal; Notable for the following components:   Glucose, Bld 144 (*)    Total Bilirubin 1.4 (*)    All other components within normal limits  URINALYSIS, ROUTINE W REFLEX MICROSCOPIC - Abnormal; Notable for the following components:   Hgb urine dipstick LARGE (*)    Ketones, ur 5 (*)    Protein, ur 30 (*)    RBC / HPF >50 (*)    Bacteria, UA RARE (*)    All other components within normal limits  LIPASE, BLOOD    EKG None  Radiology Ct Renal Stone Study  Result Date: 03/11/2018 CLINICAL DATA:  Right flank pain and nausea EXAM: CT ABDOMEN AND PELVIS WITHOUT CONTRAST TECHNIQUE: Multidetector CT imaging of the abdomen and pelvis was performed following the standard protocol without oral or IV contrast. COMPARISON:  None. FINDINGS: Lower chest: There is slight bibasilar atelectasis. No lung base edema or consolidation evident. On axial slice 8 series 4, there is a 4 mm nodular opacity in the anterior segment of the right lower lobe. Hepatobiliary: No focal liver lesions are appreciable on this noncontrast enhanced study. Gallbladder wall is not appreciably thickened. There is no biliary duct dilatation. Pancreas: There is no evident pancreatic mass or inflammatory focus. Spleen: No splenic lesions are evident. Adrenals/Urinary Tract: Adrenals bilaterally appear unremarkable. There is a 4 mm  focus of increased attenuation along the lateral aspect of the left kidney, a likely tiny hyperdense cyst. No evident hydronephrosis on either side. There is no evident renal or ureteral calculus on either side. The urinary bladder is midline with wall thickness within normal limits. Stomach/Bowel: There are sigmoid diverticula without diverticulitis. There is no appreciable bowel wall or mesenteric thickening. There is no evident bowel obstruction. No free air or portal venous air. Vascular/Lymphatic: There is atherosclerotic calcification in the aorta. No aneurysm evident. Major mesenteric arterial vessels appear patent. There is no evident adenopathy in the abdomen or pelvis. Reproductive: Prostate and seminal vesicles appear normal in size and contour. No pelvic masses appreciable. Other: There are nonobstructing appendicoliths in the appendix. No appendiceal enlargement or inflammation evident. No abscess or ascites is evident in the abdomen or pelvis. There is fat in each inguinal ring.  Musculoskeletal: There is degenerative change in the lower lumbar spine with vacuum phenomenon at L5-S1. There are no evident blastic or lytic bone lesions. No intramuscular or abdominal wall lesions are evident. IMPRESSION: 1.  No evident renal or ureteral calculus.  No hydronephrosis. 2. Sigmoid diverticula without diverticulitis. No bowel obstruction. No abscess in the abdomen or pelvis. No appendiceal inflammation. Appendicoliths are noted within the appendix. 3. 4 mm nodular opacity in the anterior segment right lower lobe. No follow-up needed if patient is low-risk. Non-contrast chest CT can be considered in 12 months if patient is high-risk. This recommendation follows the consensus statement: Guidelines for Management of Incidental Pulmonary Nodules Detected on CT Images: From the Fleischner Society 2017; Radiology 2017; 284:228-243. 4.  Aortic atherosclerosis. Aortic Atherosclerosis (ICD10-I70.0). Electronically Signed    By: Lowella Grip III M.D.   On: 03/11/2018 09:45    Procedures Procedures (including critical care time)  Medications Ordered in ED Medications - No data to display   Initial Impression / Assessment and Plan / ED Course  I have reviewed the triage vital signs and the nursing notes.  Pertinent labs & imaging results that were available during my care of the patient were reviewed by me and considered in my medical decision making (see chart for details).     Patient presents for evaluation of acute onset, resolved episode of right-sided flank pain with associated nausea and dysuria.  Patient is afebrile, vital signs are stable.  He is nontoxic in appearance.  He is completely asymptomatic on my assessment and abdomen is soft and nontender.  Lab work reviewed by me shows no leukocytosis, no anemia.  LFTs, lipase, and creatinine within normal limits.  UA suggestive of nephrolithiasis, less suggestive of UTI.  CT renal stone study shows no evident renal or ureteral calculus, no hydronephrosis.  No evidence of diverticulitis, obstruction, abscess, perforation, appendicitis, or other acute surgical abdominal pathology.  He has an incidental finding of a 4 mm nodular opacity in the anterior segment of the right lower lobe of the lung.  He is a non-smoker, does not work around a caustic environmental triggers.  He is low risk.  I did inform him of this incidental finding.  On reevaluation he is resting comfortably in no apparent distress.  Serial abdominal examinations remain benign and he is tolerating p.o. fluids in the ED without difficulty.  I suspect that he did pass a kidney stone.  He will follow-up with his PCP or urology for reevaluation of his hematuria to make sure that it resolves.  Discussed strict ED return precautions.  Patient and patient's wife verbalized understanding of and agreement with plan and patient is stable for discharge home at this time.  Final Clinical Impressions(s) /  ED Diagnoses   Final diagnoses:  Acute flank pain  Microscopic hematuria    ED Discharge Orders    None       Debroah Baller 03/11/18 1650    Hayden Rasmussen, MD 03/11/18 [phone]

## 2018-03-11 NOTE — ED Triage Notes (Signed)
Pt complains of groin pain radiating to right flank and nausea, which started right after urinating today. Pt denies hematuria or hx of kidney stone. Pain became worse upon arrival to ED.

## 2019-02-28 ENCOUNTER — Other Ambulatory Visit: Payer: Self-pay | Admitting: Internal Medicine

## 2019-02-28 ENCOUNTER — Other Ambulatory Visit (HOSPITAL_COMMUNITY): Payer: Self-pay | Admitting: Internal Medicine

## 2019-02-28 DIAGNOSIS — M5136 Other intervertebral disc degeneration, lumbar region: Secondary | ICD-10-CM

## 2019-03-01 ENCOUNTER — Ambulatory Visit (HOSPITAL_COMMUNITY)
Admission: RE | Admit: 2019-03-01 | Discharge: 2019-03-01 | Disposition: A | Discharge status: Home / Self Care | Payer: 59 | Source: Ambulatory Visit | Attending: Internal Medicine | Admitting: Internal Medicine

## 2019-03-01 ENCOUNTER — Other Ambulatory Visit: Payer: Self-pay

## 2019-03-01 DIAGNOSIS — M5136 Other intervertebral disc degeneration, lumbar region: Secondary | ICD-10-CM | POA: Diagnosis not present

## 2021-09-19 ENCOUNTER — Other Ambulatory Visit: Payer: Self-pay | Admitting: Internal Medicine

## 2021-10-11 ENCOUNTER — Other Ambulatory Visit: Payer: 59

## 2021-11-07 ENCOUNTER — Ambulatory Visit
Admission: RE | Admit: 2021-11-07 | Discharge: 2021-11-07 | Disposition: A | Discharge status: Home / Self Care | Payer: No Typology Code available for payment source | Source: Ambulatory Visit | Attending: Internal Medicine | Admitting: Internal Medicine

## 2021-11-07 DIAGNOSIS — E785 Hyperlipidemia, unspecified: Secondary | ICD-10-CM

## 2021-11-13 ENCOUNTER — Other Ambulatory Visit: Payer: Self-pay | Admitting: Internal Medicine

## 2021-11-13 DIAGNOSIS — R911 Solitary pulmonary nodule: Secondary | ICD-10-CM

## 2022-04-17 ENCOUNTER — Other Ambulatory Visit: Payer: 59

## 2022-05-15 ENCOUNTER — Other Ambulatory Visit: Payer: 59

## 2022-06-09 ENCOUNTER — Encounter: Payer: Self-pay | Admitting: Internal Medicine

## 2022-06-09 ENCOUNTER — Ambulatory Visit: Payer: 59 | Attending: Internal Medicine | Admitting: Internal Medicine

## 2022-06-09 VITALS — BP 136/82 | HR 62 | Ht 74.0 in | Wt 191.8 lb

## 2022-06-09 DIAGNOSIS — R931 Abnormal findings on diagnostic imaging of heart and coronary circulation: Secondary | ICD-10-CM

## 2022-06-09 DIAGNOSIS — Z8249 Family history of ischemic heart disease and other diseases of the circulatory system: Secondary | ICD-10-CM | POA: Diagnosis not present

## 2022-06-09 DIAGNOSIS — E785 Hyperlipidemia, unspecified: Secondary | ICD-10-CM | POA: Diagnosis not present

## 2022-06-09 MED ORDER — REPATHA SURECLICK 140 MG/ML ~~LOC~~ SOAJ: 140.0000 mg | SUBCUTANEOUS | 2 refills | Status: DC

## 2022-06-09 NOTE — Patient Instructions (Signed)
Medication Instructions:  Start aspirin 81 mg daily. *If you need a refill on your cardiac medications before your next appointment, please call your pharmacy*  Lab Work: BLOOD WORK TODAY   If you have labs (blood work) drawn today and your tests are completely normal, you will receive your results only by: MyChart Message (if you have MyChart) OR A paper copy in the mail If you have any lab test that is abnormal or we need to change your treatment, we will call you to review the results.  Testing/Procedures: None Ordered At This Time.   Follow-Up: At [facility], you and your health needs are our priority.  As part of our continuing mission to provide you with exceptional heart care, we have created designated Provider Care Teams.  These Care Teams include your primary Cardiologist (physician) and Advanced Practice Providers (APPs -  Physician Assistants and Nurse Practitioners) who all work together to provide you with the care you need, when you need it.  Your next appointment:   3 month(s) PLEASE HAVE FASTING REPEAT LABS DONE RIGHT BEFORE THIS APPOINTMENT   The format for your next appointment:   In Person  Provider:   [name], MD

## 2022-06-09 NOTE — Progress Notes (Signed)
OFFICE NOTE  Chief Complaint:  Re-establish care  Primary Care Physician: Crist Infante, MD  HPI:  Alexander [name] is a 51 year old gentleman who has had complaints of intermittent left chest and left arm pain over the last 2 years. He feels sometimes an anxious feeling in his chest, mostly during rest. Also feels some numbness and tingling in his left arm he notes most when keeping his hand outstretched like on a desk or something of that sort. Sometimes the feeling is somewhat of a burning-type sensation, again more in the left axillary area or sometimes up in the left shoulder and the left arm. There is no substernal location of this and it does not seem to occur during exercise. It is not relieved by rest. In fact, he is quite active, does a lot of exercise, a little bit of elliptical machines, pushups and sit-ups and is really not symptomatic during that exercise. He does, however, have a strong family history of heart disease. In fact, his father died of sudden cardiac death at age 65. He also I understand was recently diagnosed with dyslipidemia. I reviewed his lipoprotein panel as well as his traditional LDL content which demonstrated a high total cholesterol of 231, triglycerides 92, HDL of 40, LDL of 173. His LDL/HDL ratio was 4.3, and according to his total Lipo[employer] profile, his total LDL particle number was 1530, HDL particle number 28, small LDL-P was 339. His LP-IR score was 34. He mentioned you had recommended that this is a genetic pattern of dyslipidemia which I agree and that he may benefit from cholesterol medication; however, he does report less than ideal diet and wants to at least try to work on that some before considering medication. Based on his risk factors, I recommended a cardiac CT today weight coronary calcium and plaque. Unfortunately, I cannot convince the insurance company that this is necessary. Therefore he underwent an alternative test which was metabolic testing.  His midchest reveals good effort with a mildly reduced PO2 in the upper 80s. There was a slight ischemic response, suggesting possible small vessel coronary disease. However the study was considered low risk.  [name] continues to have symptoms but feels more like a burning in his left arm left shoulder and upper back. The symptoms are somewhat worse at night when he is laying down trying to go to sleep. He does report that he's been fairly busy at work and eats late at night. He also feels sometimes like he needs to loosen his belt because of some tightness in the abdomen. He does not report any worsening flatus or eructation more than normal. His symptoms are not worse when exerting himself, mowing the lawn or other activities. He recently saw his primary care provider who recommended a CT angiogram however this was denied by the insurance company.  Based on our inability to undergo CT angiography due to insurance company denial, I offered further testing including stress testing or cardiac catheterization. He is quite leery of the risk of cardiac catheterization, which is low, but opted for an exercise nuclear stress test. He underwent a stress test on 05/11/2013. The nuclear stress test was normal.  06/09/2022  Mr. Alexander [name] is a pleasant 51 year old male who is considered a new patient as I last saw him back in 2014.  At that time he was describing somewhat atypical chest pain and workup was negative.  He does have a strong family history of early onset heart disease and his PCP recently  performed a calcium score.  This was significantly elevated at 826, 99th percentile for age and sex matched controls and he was subsequently referred back for evaluation and management.  He reports being asymptomatic.  He is quite active but does not exercise regularly.  He denies any chest pain or shortness of breath.  He does a lot of yard work and has had no symptoms with that.  He has been on atorvastatin for a  number of years without issues.  His most recent lipid profile showed total cholesterol 129, triglycerides 55, HDL 39 and LDL 79.  Based on this he was prescribed Repatha which she had just recently started.  There was some confusion on dosing as he was apparently instructed to take 1 injection every 3 weeks.  Therefore he is only receiving 1 pen per month, which is not the standard prescription for this medication.  He has not had an LP(a) assessment.  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 He has quit using smokeless tobacco.  His smokeless tobacco use included snuff. He reports that he does not drink alcohol and does not use drugs.  ALLERGIES:  No Known Allergies  ROS: Pertinent items noted in HPI and remainder of comprehensive ROS otherwise negative.  HOME MEDS: Current Outpatient Medications  Medication Sig Dispense Refill   atorvastatin (LIPITOR) 40 MG tablet Take 1 tablet by mouth daily.     Coenzyme Q10 (COQ10) 200 MG CAPS Take 1 tablet by mouth daily.     REPATHA SURECLICK 127 MG/ML SOAJ [id number] Milligram(s) SUB-Q Every 3 Weeks     pantoprazole (PROTONIX) 40 MG tablet Take 1 tab with breakfast and dinner. 60 tablet 2   No current facility-administered medications for this visit.    LABS/IMAGING: No results found for this or any previous visit (from the past 48 hour(s)). No results found.  VITALS: BP 136/82 (BP Location: Left Arm, Patient Position: Sitting, Cuff Size: Normal)   Pulse 62   Ht 6' 2"  (1.88 m)   Wt 191 lb 12.8 oz (87 kg)   SpO2 99%   BMI 24.63 kg/m   EXAM: General appearance: alert, no distress, and normal weight Neck: no carotid bruit, no JVD, and thyroid not enlarged, symmetric, no tenderness/mass/nodules Lungs: clear to auscultation bilaterally Heart: regular rate and rhythm, S1, S2 normal, no murmur, click, rub or gallop Abdomen: soft, non-tender; bowel sounds normal; no masses,  no organomegaly Extremities: extremities normal, atraumatic, no cyanosis or edema Pulses: 2+ and symmetric Skin: Skin color, texture, turgor normal. No rashes or lesions Neurologic: Grossly normal Psych: Pleasant  EKG: Normal sinus rhythm at 62, nonspecific IVCD-personally reviewed  ASSESSMENT: Elevated CAC score of 826, 99th percentile for age and sex matched controls Low risk cardiac metabolic stress testing and nuclear stress test (2014) Atypical chest pain-likely related to anxiety  Dyslipidemia, goal LDL <70 Family history of premature coronary artery disease  PLAN: 1.   Mr. Boule returns to reestablish cardiac care.  He had an elevated coronary artery calcium score and does have a family history of early onset heart disease in his father.  He seems to be asymptomatic, and therefore I would not pursue ischemia testing despite his very high calcium score.  I would recommend starting daily low-dose aspirin 81 mg.  He has recently been started on Repatha in addition to atorvastatin.  I think this is a good idea and will order repeat lipids.  He should also have an LP(a) assessed as I suspect this may be high.  He does not have children but does have a sibling who should have lipids tested.  Will plan to see him back in a few months after repeat labs.  Thanks again for referring him back for care.  Pixie Casino, MD, [facility], [place] Director of the Advanced Lipid Disorders &  Cardiovascular Risk Reduction Clinic Diplomate of the American  Board of Clinical Lipidology Attending Cardiologist  Direct Dial: [phone]  Fax: [phone]  Website:  [email or web address] 06/09/2022, 8:37 AM

## 2022-06-10 ENCOUNTER — Other Ambulatory Visit: Payer: Self-pay | Admitting: Internal Medicine

## 2022-06-10 DIAGNOSIS — R911 Solitary pulmonary nodule: Secondary | ICD-10-CM

## 2022-06-10 LAB — LIPOPROTEIN A (LPA): Lipoprotein (a): 16.9 nmol/L (ref <75.0)

## 2022-07-17 ENCOUNTER — Ambulatory Visit
Admission: RE | Admit: 2022-07-17 | Discharge: 2022-07-17 | Disposition: A | Discharge status: Home / Self Care | Payer: 59 | Source: Ambulatory Visit | Attending: Internal Medicine | Admitting: Internal Medicine

## 2022-07-17 DIAGNOSIS — R911 Solitary pulmonary nodule: Secondary | ICD-10-CM

## 2022-09-03 ENCOUNTER — Other Ambulatory Visit: Payer: Self-pay

## 2022-09-03 DIAGNOSIS — E785 Hyperlipidemia, unspecified: Secondary | ICD-10-CM

## 2022-09-05 LAB — NMR, LIPOPROFILE
Cholesterol, Total: 76 mg/dL — ABNORMAL LOW (ref 100–199)
HDL Particle Number: 28.6 umol/L — ABNORMAL LOW (ref >=30.5)
HDL-C: 45 mg/dL (ref >39)
LDL Particle Number: <300 nmol/L (ref <1000)
LDL-C (NIH Calc): 18 mg/dL (ref 0–99)
LP-IR Score: 27 (ref <=45)
Small LDL Particle Number: <90 nmol/L (ref <=527)
Triglycerides: 53 mg/dL (ref 0–149)

## 2022-09-07 ENCOUNTER — Other Ambulatory Visit: Payer: Self-pay | Admitting: Internal Medicine

## 2022-09-10 ENCOUNTER — Ambulatory Visit: Payer: 59 | Attending: Internal Medicine | Admitting: Internal Medicine

## 2022-09-10 ENCOUNTER — Encounter: Payer: Self-pay | Admitting: Internal Medicine

## 2022-09-10 VITALS — BP 140/84 | HR 73 | Ht 74.0 in | Wt 191.0 lb

## 2022-09-10 DIAGNOSIS — Z8249 Family history of ischemic heart disease and other diseases of the circulatory system: Secondary | ICD-10-CM | POA: Diagnosis not present

## 2022-09-10 DIAGNOSIS — E785 Hyperlipidemia, unspecified: Secondary | ICD-10-CM | POA: Diagnosis not present

## 2022-09-10 DIAGNOSIS — R931 Abnormal findings on diagnostic imaging of heart and coronary circulation: Secondary | ICD-10-CM | POA: Diagnosis not present

## 2022-09-10 NOTE — Progress Notes (Signed)
OFFICE NOTE  Chief Complaint:  Re-establish care  Primary Care Physician: Crist Infante, MD  HPI:  Alexander [name] is a 52 year old gentleman who has had complaints of intermittent left chest and left arm pain over the last 2 years. He feels sometimes an anxious feeling in his chest, mostly during rest. Also feels some numbness and tingling in his left arm he notes most when keeping his hand outstretched like on a desk or something of that sort. Sometimes the feeling is somewhat of a burning-type sensation, again more in the left axillary area or sometimes up in the left shoulder and the left arm. There is no substernal location of this and it does not seem to occur during exercise. It is not relieved by rest. In fact, he is quite active, does a lot of exercise, a little bit of elliptical machines, pushups and sit-ups and is really not symptomatic during that exercise. He does, however, have a strong family history of heart disease. In fact, his father died of sudden cardiac death at age 65. He also I understand was recently diagnosed with dyslipidemia. I reviewed his lipoprotein panel as well as his traditional LDL content which demonstrated a high total cholesterol of 231, triglycerides 92, HDL of 40, LDL of 173. His LDL/HDL ratio was 4.3, and according to his total LipoScience profile, his total LDL particle number was 1530, HDL particle number 28, small LDL-P was 339. His LP-IR score was 34. He mentioned you had recommended that this is a genetic pattern of dyslipidemia which I agree and that he may benefit from cholesterol medication; however, he does report less than ideal diet and wants to at least try to work on that some before considering medication. Based on his risk factors, I recommended a cardiac CT today weight coronary calcium and plaque. Unfortunately, I cannot convince the insurance company that this is necessary. Therefore he underwent an alternative test which was metabolic testing.  His midchest reveals good effort with a mildly reduced PO2 in the upper 80s. There was a slight ischemic response, suggesting possible small vessel coronary disease. However the study was considered low risk.  Alexander [name] continues to have symptoms but feels more like a burning in his left arm left shoulder and upper back. The symptoms are somewhat worse at night when he is laying down trying to go to sleep. He does report that he's been fairly busy at work and eats late at night. He also feels sometimes like he needs to loosen his belt because of some tightness in the abdomen. He does not report any worsening flatus or eructation more than normal. His symptoms are not worse when exerting himself, mowing the lawn or other activities. He recently saw his primary care provider who recommended a CT angiogram however this was denied by the insurance company.  Based on our inability to undergo CT angiography due to insurance company denial, I offered further testing including stress testing or cardiac catheterization. He is quite leery of the risk of cardiac catheterization, which is low, but opted for an exercise nuclear stress test. He underwent a stress test on 05/11/2013. The nuclear stress test was normal.  06/09/2022  Alexander [name] is a pleasant 52 year old male who is considered a new patient as I last saw him back in 2014.  At that time he was describing somewhat atypical chest pain and workup was negative.  He does have a strong family history of early onset heart disease and his PCP recently  performed a calcium score.  This was significantly elevated at 826, 99th percentile for age and sex matched controls and he was subsequently referred back for evaluation and management.  He reports being asymptomatic.  He is quite active but does not exercise regularly.  He denies any chest pain or shortness of breath.  He does a lot of yard work and has had no symptoms with that.  He has been on atorvastatin for a  number of years without issues.  His most recent lipid profile showed total cholesterol 129, triglycerides 55, HDL 39 and LDL 79.  Based on this he was prescribed Repatha which she had just recently started.  There was some confusion on dosing as he was apparently instructed to take 1 injection every 3 weeks.  Therefore he is only receiving 1 pen per month, which is not the standard prescription for this medication.  He has not had an LP(a) assessment.  09/10/2022  Alexander [name] seen today in follow-up.  He has had a significant improvement in his lipids on Repatha in addition to atorvastatin.  Recent NMR showed a particle number less than 300, LDL-C of 18, HDL-C of 45 and triglycerides were 53.  Total cholesterol 76 with a small LDL particle number less than 90.  His LP(a) fortunately was negative at 16.9 nmol/L.  He reports tolerating the medicine well.  He has had a couple of issues with missed firing of his Repatha pen but however his cholesterol has responded well despite this.  He wants to start to become more active.  I have no concerns about this at this time.  He has been asymptomatic and did a lot of yard work this past weekend noted no symptoms with that.  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 He has quit using smokeless tobacco.  His smokeless tobacco use included snuff. He reports that he does not drink alcohol and does not use drugs.  ALLERGIES:  No Known Allergies  ROS: Pertinent items noted in HPI and remainder  of comprehensive ROS otherwise negative.  HOME MEDS: Current Outpatient Medications  Medication Sig Dispense Refill   atorvastatin (LIPITOR) 40 MG tablet Take 1 tablet by mouth daily.     Coenzyme Q10 (COQ10) 200 MG CAPS Take 1 tablet by mouth daily.     REPATHA SURECLICK [id number] MG/ML SOAJ ADMINISTER 1 ML UNDER THE SKIN EVERY 14 DAYS 2 mL 11   No current facility-administered medications for this visit.    LABS/IMAGING: No results found for this or any previous visit (from the past 48 hour(s)). No results found.  VITALS: BP (!) 140/84 (BP Location: Left Arm, Patient Position: Sitting, Cuff Size: Normal)   Pulse 73   Ht '6\' 2"'$  (1.88 m)   Wt 191 lb (86.6 kg)   BMI 24.52 kg/m   EXAM: Deferred  EKG: Deferred  ASSESSMENT: Elevated CAC score of 826, 99th percentile for age and sex matched controls Low risk cardiac metabolic stress testing and nuclear stress test (2014) Atypical chest pain-likely related to anxiety Dyslipidemia, goal LDL <70 Family history of premature coronary artery disease Negative LP(a)  PLAN: 1.   Alexander [name] has had significant lipid lowering with the addition of Repatha to his atorvastatin.  His LP(a) was negative.  He is asymptomatic and has done a lot of heavy yard work recently.  I have no hesitation for him to start an exercise routine.  He should monitor for any abnormal symptoms.  Will plan repeat lipids in 6 months and follow-up with me afterwards.  Pixie Casino, MD, [facility], [place] Director of the Advanced Lipid Disorders &  Cardiovascular Risk Reduction Clinic Diplomate of the American Board of Clinical Lipidology Attending Cardiologist  Direct Dial: [phone]  Fax: [phone]  Website:  [name] 09/10/2022, 1:58 PM

## 2022-09-10 NOTE — Patient Instructions (Signed)
Medication Instructions:  NO CHANGES  *If you need a refill on your cardiac medications before your next appointment, please call your pharmacy*   Lab Work: FASTING lab work to check cholesterol in 6 months ** complete 1 week before your next visit  If you have labs (blood work) drawn today and your tests are completely normal, you will receive your results only by: [place] (if you have MyChart) OR A paper copy in the mail If you have any lab test that is abnormal or we need to change your treatment, we will call you to review the results.   Testing/Procedures: NONE   Follow-Up: At [facility], you and your health needs are our priority.  As part of our continuing mission to provide you with exceptional heart care, we have created designated Provider Care Teams.  These Care Teams include your primary Cardiologist (physician) and Advanced Practice Providers (APPs -  Physician Assistants and Nurse Practitioners) who all work together to provide you with the care you need, when you need it.  We recommend signing up for the patient portal called "MyChart".  Sign up information is provided on this After Visit Summary.  MyChart is used to connect with patients for Virtual Visits (Telemedicine).  Patients are able to view lab/test results, encounter notes, upcoming appointments, etc.  Non-urgent messages can be sent to your provider as well.   To learn more about what you can do with MyChart, go to [email or web address].    Your next appointment:   6 month(s)  Provider:   Pixie Casino, MD

## 2022-11-04 ENCOUNTER — Encounter: Payer: Self-pay | Admitting: Orthopaedic Surgery

## 2022-11-04 ENCOUNTER — Ambulatory Visit (INDEPENDENT_AMBULATORY_CARE_PROVIDER_SITE_OTHER): Payer: 59 | Admitting: Orthopaedic Surgery

## 2022-11-04 ENCOUNTER — Other Ambulatory Visit (INDEPENDENT_AMBULATORY_CARE_PROVIDER_SITE_OTHER): Payer: 59

## 2022-11-04 DIAGNOSIS — M25551 Pain in right hip: Secondary | ICD-10-CM

## 2022-11-04 NOTE — Progress Notes (Signed)
Office Visit Note   Patient: Alexander [name]           Date of Birth: 05/23/1971           MRN: [id number] Visit Date: 11/04/2022              Requested by: Rodrigo Ran, MD [address] [place],  [place] [address] PCP: Rodrigo Ran, MD   Assessment & Plan: Visit Diagnoses:  1. Pain of right hip     Plan: Impression is 52 year old gentleman with right groin pain.  He may have a small labral tear that bothers him occasionally.  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  Offered physical therapy but the patient would like to do his own strengthening.  If the symptoms become more constant or severe could always consider intra-articular steroid injection.  For now he will continue with symptomatic treatment.  Follow-up as needed.  Follow-Up Instructions: No follow-ups on file.   Orders:  Orders Placed This Encounter  Procedures   XR HIP UNILAT W OR W/O PELVIS 2-3 VIEWS RIGHT   No orders of the defined types were placed in this encounter.     Procedures: No procedures performed   Clinical Data: No additional findings.   Subjective: Chief Complaint  Patient presents with   Right Hip - Pain    HPI  Alexander [name] is a very pleasant 52 year old gentleman referral from Dr. Sherrye Payor for right groin pain into the thigh for a year.  He is very active.  Has a history of left inguinal hernia that was surgically repaired but Dr. Lacie Scotts does not feel that his right groin pain is due to a hernia therefore he wants him checked antibiotics.  He states that walking increases the pain.  Has not felt significant relief from Advil or Aleve.  Pain comes and goes.  Review of Systems  Constitutional: Negative.   HENT: Negative.    Eyes: Negative.   Respiratory: Negative.    Cardiovascular: Negative.   Gastrointestinal: Negative.   Endocrine: Negative.   Genitourinary: Negative.   Skin: Negative.   Allergic/Immunologic: Negative.   Neurological: Negative.   Hematological:  Negative.   Psychiatric/Behavioral: Negative.    All other systems reviewed and are negative.    Objective: Vital Signs: There were no vitals taken for this visit.  Physical Exam Vitals and nursing note reviewed.  Constitutional:      Appearance: He is well-developed.  HENT:     Head: Normocephalic and atraumatic.  Eyes:     Pupils: Pupils are equal, round, and reactive to light.  Pulmonary:     Effort: Pulmonary effort is normal.  Abdominal:     Palpations: Abdomen is soft.  Musculoskeletal:        General: Normal range of motion.     Cervical back: Neck supple.  Skin:    General: Skin is warm.  Neurological:     Mental Status: He is alert and oriented to person, place, and time.  Psychiatric:        Behavior: Behavior normal.        Thought Content: Thought content normal.        Judgment: Judgment normal.    Ortho Exam  Examination right hip shows full fluid range of motion.  I am not able to reproduce any pain in the groin today.  No sciatic tension signs.  Specialty Comments:  No specialty comments available.  Imaging: No results found.   PMFS  History: Patient Active Problem List   Diagnosis Date Noted   Anxiety 05/13/2013   GERD (gastroesophageal reflux disease) 04/11/2013   Dyslipidemia 12/24/2012   Family history of premature CAD 12/24/2012   Atypical chest pain 12/24/2012   Past Medical History:  Diagnosis Date   Family history of premature CAD    H/O chest pain May 2014   met test   Hernia cerebri (HCC)    Hyperlipidemia     Family History  Problem Relation Age of Onset   Sudden death Father        sudeen cardiac death in 32s   Cancer Mother        abdominal ca   Heart attack Maternal Grandfather     Past Surgical History:  Procedure Laterality Date   deviated septal surgery  1997   HERNIA REPAIR  1999   Social History   Occupational History   Occupation: Psychologist, sport and exercise    Employer: [employer]  Tobacco Use   Smoking  status: Never   Smokeless tobacco: Former    Types: Snuff   Tobacco comments:    dip - 25 years ago  Substance and Sexual Activity   Alcohol use: No    Alcohol/week: 0.0 standard drinks of alcohol   Drug use: No   Sexual activity: Not on file

## 2022-11-07 IMAGING — CT CT CARDIAC CORONARY ARTERY CALCIUM SCORE
2 of 3 series · 10 of 20 positions shown, 12 images · non-contrast
Comparison: None Available.

CLINICAL DATA: 51 year old white male

EXAM:
CT CARDIAC CORONARY ARTERY CALCIUM SCORE
TECHNIQUE: Non-contrast imaging through the heart was performed using
prospective ECG gating. Image post processing was performed on an
independent workstation, allowing for quantitative analysis of the
heart and coronary arteries. Note that this exam targets the heart
and the chest was not imaged in its entirety.

[Series 3: calcium scoring 2.00 br40 bestdiast 70% axial · axial · 0.53mm/px · z∈[+1569,+1689]mm · 5 of 90 slices shown, 7 images]
[im 15/90  vessel]
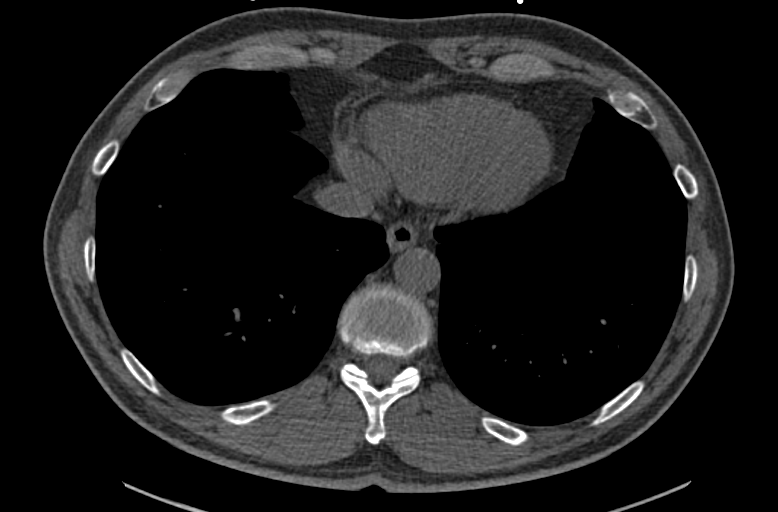
[im 15/90  lung]
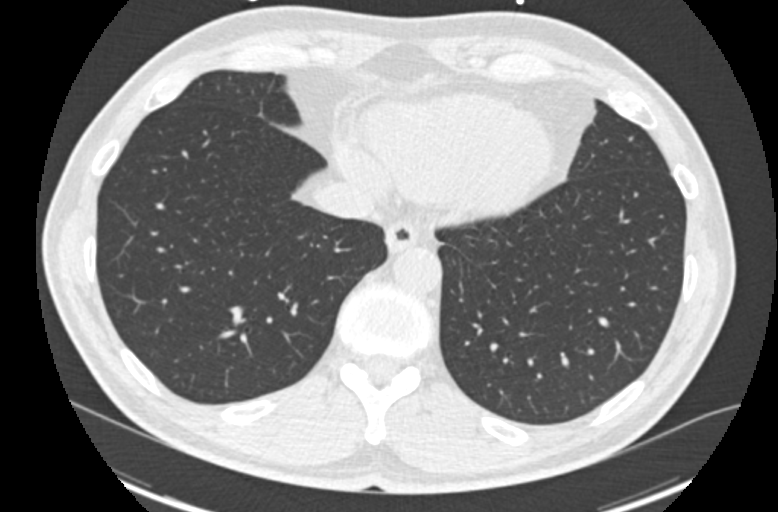
[im 30/90  vessel]
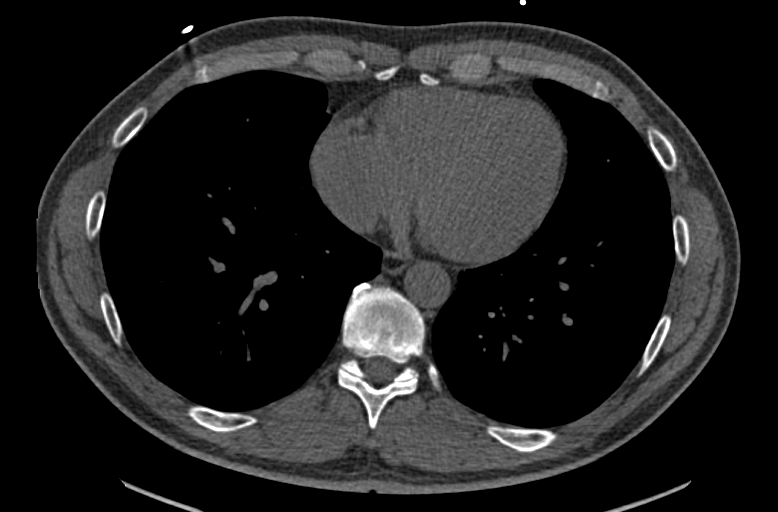
[im 45/90  vessel]
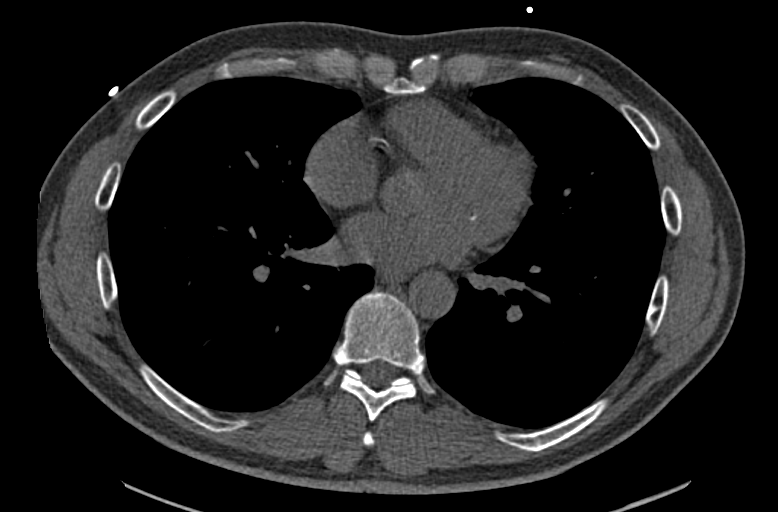
[im 60/90  vessel]
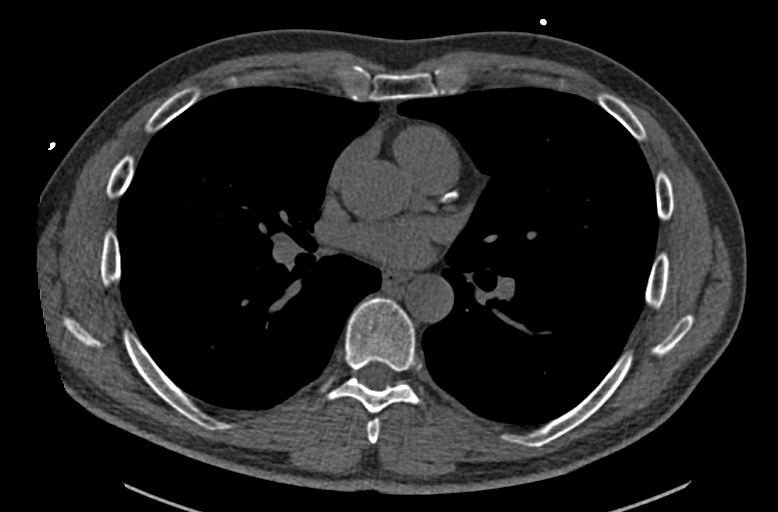
[im 75/90  vessel]
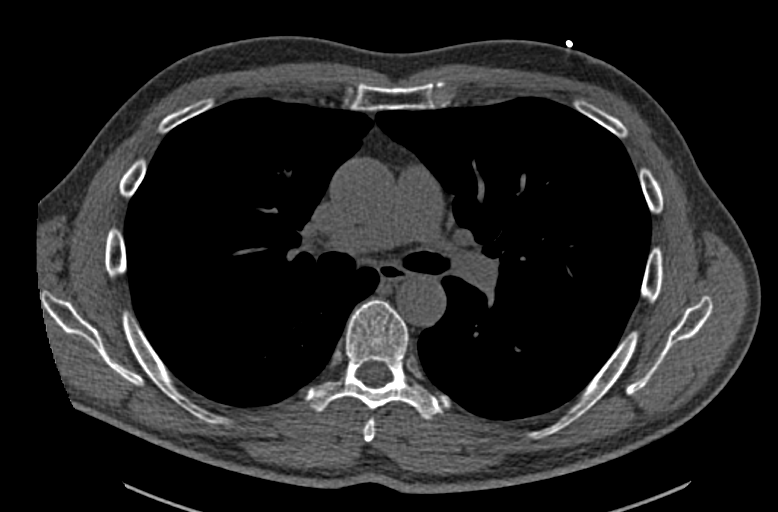
[im 75/90  lung]
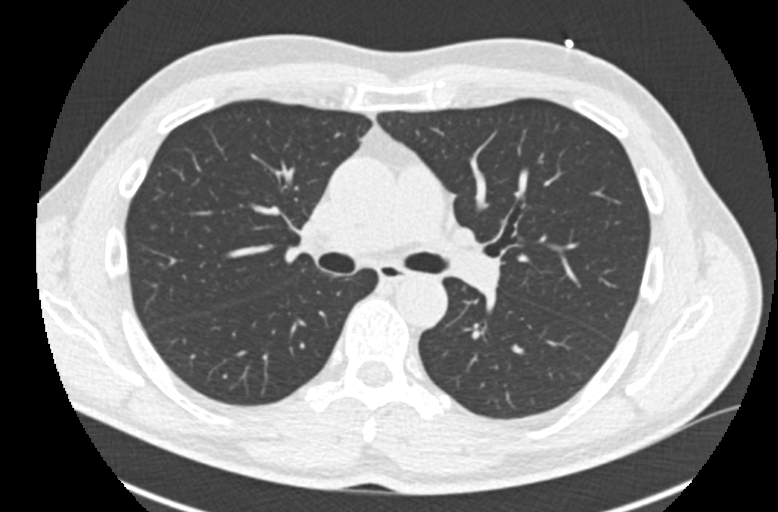

[Series 9: calcium scoring 2.00 br60 bestdiast 70% lungs · axial · 0.53mm/px · z∈[+1569,+1689]mm · 5 of 90 slices shown]
[im 15/90  vessel]
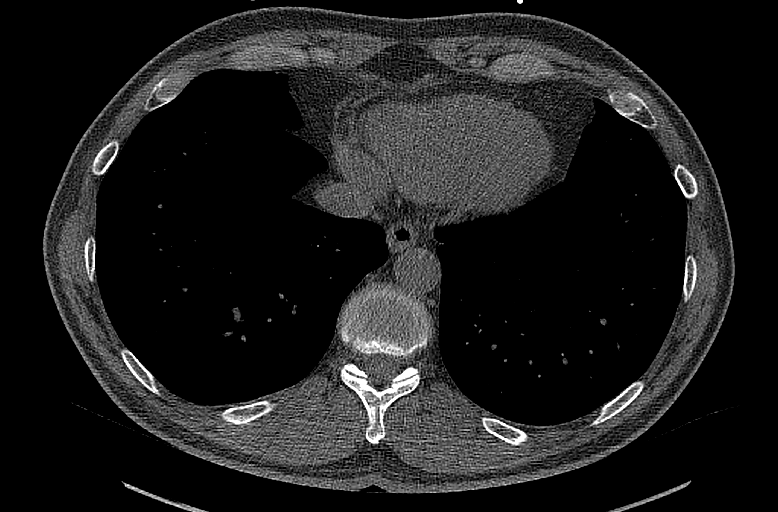
[im 30/90  vessel]
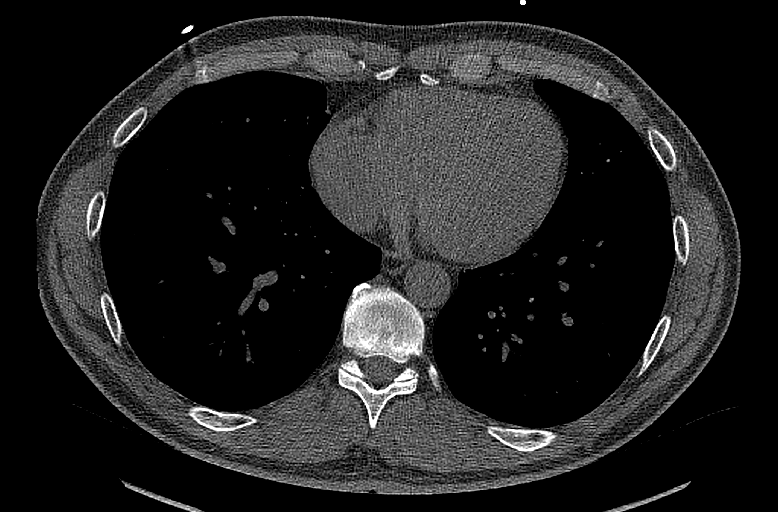
[im 45/90  vessel]
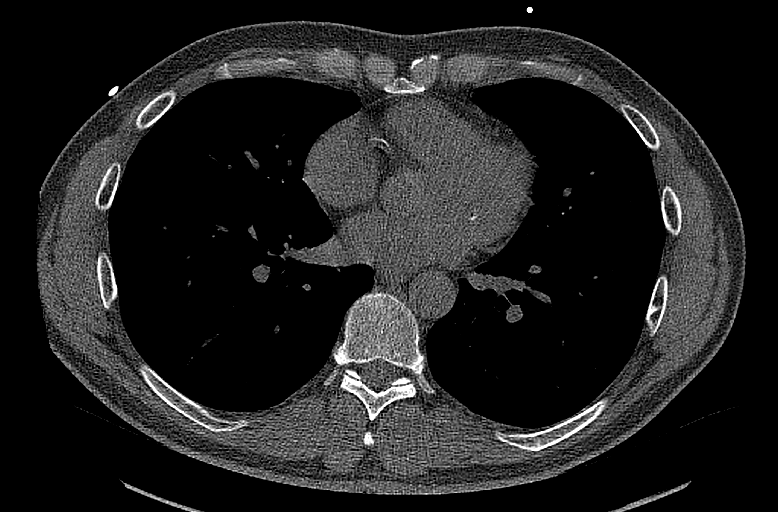
[im 60/90  vessel]
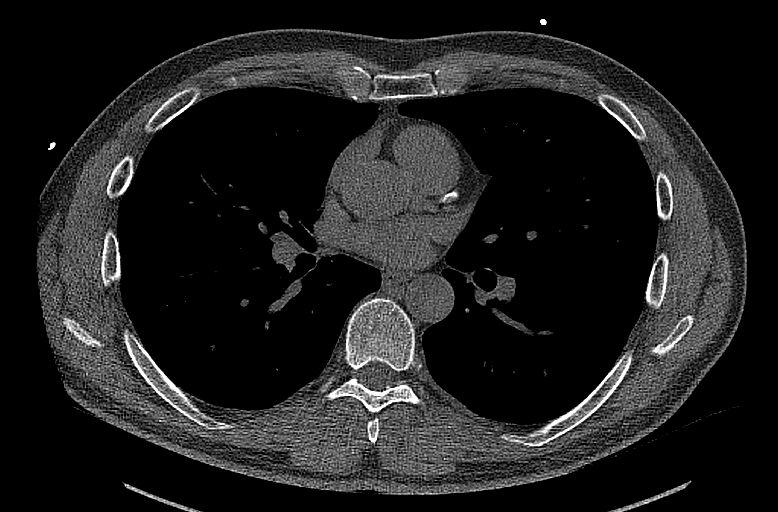
[im 75/90  vessel]
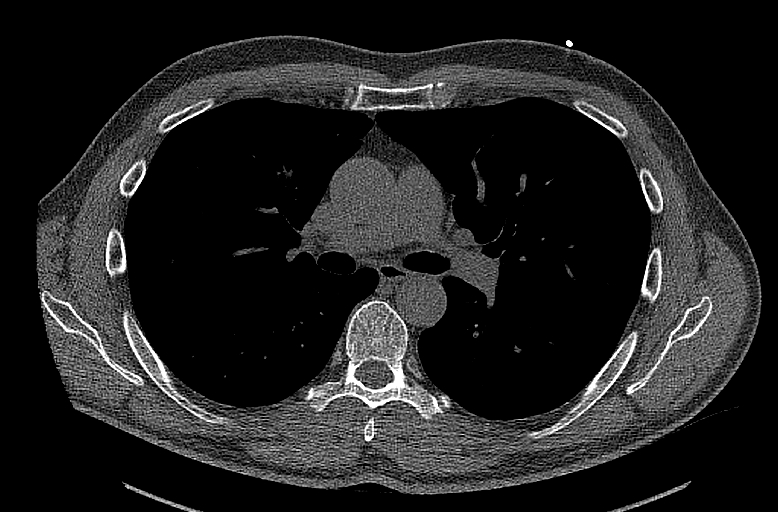

[10 of 20 positions shown; findings below may reference images not displayed]

FINDINGS: CORONARY CALCIUM SCORES:

Left Main: 0

LAD: 529

LCx:

RCA: 237

Total Agatston Score: 826

[HOSPITAL] percentile: 99th

AORTA MEASUREMENTS:

Ascending Aorta: 34 mm

Descending Aorta: 24 mm

OTHER FINDINGS:

Vascular: Normal heart size. No pericardial effusion. Normal caliber
thoracic aorta with mild calcified plaque.

Mediastinum/Nodes: Esophagus is unremarkable. No pathologically
enlarged lymph nodes seen in the chest.

Lungs/Pleura: Central airways are patent. No consolidation, pleural
effusion or pneumothorax. Bilateral small solid pulmonary nodules.
Largest is a juxtapleural solid nodule in the right lower lobe
measuring 6 mm in mean diameter on series 9 image 73.

Upper Abdomen: No acute abnormality.

Musculoskeletal: No chest wall mass or suspicious bone lesions
identified.
IMPRESSION: 1. Total Agatston Score: 826
2. [HOSPITAL] percentile: 99th
3. Small bilateral solid pulmonary nodules, largest is located in
the right lower lobe measures 6 mm in mean diameter. Non-contrast
chest CT at 3-6 months is recommended. If the nodules are stable at
time of repeat CT, then future CT at 18-24 months (from today's
scan) is considered optional for low-risk patients, but is
recommended for high-risk patients. This recommendation follows the
consensus statement: Guidelines for Management of Incidental
Pulmonary Nodules Detected on CT Images: From the [HOSPITAL]

## 2023-03-20 ENCOUNTER — Encounter: Payer: Self-pay | Admitting: Internal Medicine

## 2023-03-20 ENCOUNTER — Ambulatory Visit: Payer: 59 | Attending: Internal Medicine | Admitting: Internal Medicine

## 2023-03-20 VITALS — BP 142/90 | HR 67 | Ht 74.0 in | Wt 194.0 lb

## 2023-03-20 DIAGNOSIS — R931 Abnormal findings on diagnostic imaging of heart and coronary circulation: Secondary | ICD-10-CM

## 2023-03-20 DIAGNOSIS — E785 Hyperlipidemia, unspecified: Secondary | ICD-10-CM | POA: Diagnosis not present

## 2023-03-20 DIAGNOSIS — Z01810 Encounter for preprocedural cardiovascular examination: Secondary | ICD-10-CM | POA: Diagnosis not present

## 2023-03-20 NOTE — Progress Notes (Signed)
OFFICE NOTE  Chief Complaint:  Follow-up  Primary Care Physician: Rodrigo Ran, MD  HPI:  Alexander [name] is a 52 year old gentleman who has had complaints of intermittent left chest and left arm pain over the last 2 years. He feels sometimes an anxious feeling in his chest, mostly during rest. Also feels some numbness and tingling in his left arm he notes most when keeping his hand outstretched like on a desk or something of that sort. Sometimes the feeling is somewhat of a burning-type sensation, again more in the left axillary area or sometimes up in the left shoulder and the left arm. There is no substernal location of this and it does not seem to occur during exercise. It is not relieved by rest. In fact, he is quite active, does a lot of exercise, a little bit of elliptical machines, pushups and sit-ups and is really not symptomatic during that exercise. He does, however, have a strong family history of heart disease. In fact, his father died of sudden cardiac death at age 56. He also I understand was recently diagnosed with dyslipidemia. I reviewed his lipoprotein panel as well as his traditional LDL content which demonstrated a high total cholesterol of 231, triglycerides 92, HDL of 40, LDL of 173. His LDL/HDL ratio was 4.3, and according to his total [employer] profile, his total LDL particle number was 1530, HDL particle number 28, small LDL-P was 339. His LP-IR score was 34. He mentioned you had recommended that this is a genetic pattern of dyslipidemia which I agree and that he may benefit from cholesterol medication; however, he does report less than ideal diet and wants to at least try to work on that some before considering medication. Based on his risk factors, I recommended a cardiac CT today weight coronary calcium and plaque. Unfortunately, I cannot convince the insurance company that this is necessary. Therefore he underwent an alternative test which was metabolic testing. His  midchest reveals good effort with a mildly reduced PO2 in the upper 80s. There was a slight ischemic response, suggesting possible small vessel coronary disease. However the study was considered low risk.  Mr. [name] continues to have symptoms but feels more like a burning in his left arm left shoulder and upper back. The symptoms are somewhat worse at night when he is laying down trying to go to sleep. He does report that he's been fairly busy at work and eats late at night. He also feels sometimes like he needs to loosen his belt because of some tightness in the abdomen. He does not report any worsening flatus or eructation more than normal. His symptoms are not worse when exerting himself, mowing the lawn or other activities. He recently saw his primary care provider who recommended a CT angiogram however this was denied by the insurance company.  Based on our inability to undergo CT angiography due to insurance company denial, I offered further testing including stress testing or cardiac catheterization. He is quite leery of the risk of cardiac catheterization, which is low, but opted for an exercise nuclear stress test. He underwent a stress test on 05/11/2013. The nuclear stress test was normal.  06/09/2022  Alexander [name] is a pleasant 52 year old male who is considered a new patient as I last saw him back in 2014.  At that time he was describing somewhat atypical chest pain and workup was negative.  He does have a strong family history of early onset heart disease and his PCP recently performed  a calcium score.  This was significantly elevated at 826, 99th percentile for age and sex matched controls and he was subsequently referred back for evaluation and management.  He reports being asymptomatic.  He is quite active but does not exercise regularly.  He denies any chest pain or shortness of breath.  He does a lot of yard work and has had no symptoms with that.  He has been on atorvastatin for a  number of years without issues.  His most recent lipid profile showed total cholesterol 129, triglycerides 55, HDL 39 and LDL 79.  Based on this he was prescribed Repatha which she had just recently started.  There was some confusion on dosing as he was apparently instructed to take 1 injection every 3 weeks.  Therefore he is only receiving 1 pen per month, which is not the standard prescription for this medication.  He has not had an LP(a) assessment.  09/10/2022  Alexander [name] seen today in follow-up.  He has had a significant improvement in his lipids on Repatha in addition to atorvastatin.  Recent NMR showed a particle number less than 300, LDL-C of 18, HDL-C of 45 and triglycerides were 53.  Total cholesterol 76 with a small LDL particle number less than 90.  His LP(a) fortunately was negative at 16.9 nmol/L.  He reports tolerating the medicine well.  He has had a couple of issues with missed firing of his Repatha pen but however his cholesterol has responded well despite this.  He wants to start to become more active.  I have no concerns about this at this time.  He has been asymptomatic and did a lot of yard work this past weekend noted no symptoms with that.  03/20/2023  Alexander [name] seen today in follow-up.  He continues to do well on combination atorvastatin and Repatha.  He told me he had a problem with one of the injectors just prior to her when he got his lab work and he had to skip that dose.  He took it recently but it may have led to slightly higher than normal readings.  Nonetheless his cholesterol still very low with total 114, HDL 59, triglycerides 61 and LDL 42.  He remains asymptomatic from a cardiac standpoint, but unfortunately is been having issues with the hernia including significant pain is caused poor sleep at night.  This might explain why his blood pressure was elevated today.  He has no known diagnosis of hypertension.  He says he has an upcoming appointment with his surgeon  today.  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 He has quit using smokeless tobacco.  His smokeless tobacco use included snuff. He reports that he does not drink alcohol and does not use drugs.  ALLERGIES:  No Known Allergies  ROS: Pertinent items noted in HPI and  remainder of comprehensive ROS otherwise negative.  HOME MEDS: Current Outpatient Medications  Medication Sig Dispense Refill   atorvastatin (LIPITOR) 40 MG tablet Take 1 tablet by mouth daily.     Coenzyme Q10 (COQ10) 200 MG CAPS Take 1 tablet by mouth daily.     REPATHA SURECLICK 140 MG/ML SOAJ ADMINISTER 1 ML UNDER THE SKIN EVERY 14 DAYS 2 mL 11   No current facility-administered medications for this visit.    LABS/IMAGING: No results found for this or any previous visit (from the past 48 hour(s)). No results found.  VITALS: BP (!) 142/90   Pulse 67   Ht 6\' 2"  (1.88 m)   Wt 194 lb (88 kg)   SpO2 99%   BMI 24.91 kg/m   EXAM: Deferred  EKG: Deferred  ASSESSMENT: Elevated CAC score of 826, 99th percentile for age and sex matched controls Low risk cardiac metabolic stress testing and nuclear stress test (2014) Atypical chest pain-likely related to anxiety Dyslipidemia, goal LDL <70 Family history of premature coronary artery disease Negative LP(a)  PLAN: 1.   Mr. Collens seems to be doing well and is asymptomatic from a cardiac standpoint.  I would think he is at low risk at this point for hernia repair if that is necessary.  I will  message his surgeon.  His cholesterol is well-controlled.  Blood pressure was a little elevated today however he is in significant pain and has not slept well in several days.  He did have nuclear stress test in 2014 which was negative.  Follow-up with me annually or sooner as necessary.  [name], MD, [facility], FACP  [place]  [facility]  Medical Director of the Advanced Lipid Disorders &  Cardiovascular Risk Reduction Clinic Diplomate of the American Board of Clinical Lipidology Attending Cardiologist  Direct Dial: [phone]  Fax: [phone]  Website:  [email or web address] Nicely Lien Lyman 03/20/2023, 8:06 AM

## 2023-03-20 NOTE — Patient Instructions (Signed)
Medication Instructions:  NO CHANGES  *If you need a refill on your cardiac medications before your next appointment, please call your pharmacy*   Lab Work: FASTING labs in 1 year - before next visit  If you have labs (blood work) drawn today and your tests are completely normal, you will receive your results only by: MyChart Message (if you have MyChart) OR A paper copy in the mail If you have any lab test that is abnormal or we need to change your treatment, we will call you to review the results.   Follow-Up: At [facility], you and your health needs are our priority.  As part of our continuing mission to provide you with exceptional heart care, we have created designated Provider Care Teams.  These Care Teams include your primary Cardiologist (physician) and Advanced Practice Providers (APPs -  Physician Assistants and Nurse Practitioners) who all work together to provide you with the care you need, when you need it.  We recommend signing up for the patient portal called "MyChart".  Sign up information is provided on this After Visit Summary.  MyChart is used to connect with patients for Virtual Visits (Telemedicine).  Patients are able to view lab/test results, encounter notes, upcoming appointments, etc.  Non-urgent messages can be sent to your provider as well.   To learn more about what you can do with MyChart, go to [email or web address].    Your next appointment:    12 months with Dr. Rennis Golden

## 2023-04-10 ENCOUNTER — Other Ambulatory Visit (HOSPITAL_COMMUNITY): Payer: Self-pay

## 2023-04-13 ENCOUNTER — Telehealth: Payer: Self-pay | Admitting: Pharmacy Technician

## 2023-04-13 ENCOUNTER — Other Ambulatory Visit (HOSPITAL_COMMUNITY): Payer: Self-pay

## 2023-04-13 NOTE — Telephone Encounter (Signed)
Pharmacy Patient Advocate Encounter   Received notification from CoverMyMeds that prior authorization for repatha is required/requested.   Insurance verification completed.   The patient is insured through [facility] .   Per test claim: PA required; PA started via CoverMyMeds. KEY [id number] . Waiting for clinical questions to populate.

## 2023-04-14 ENCOUNTER — Other Ambulatory Visit (HOSPITAL_COMMUNITY): Payer: Self-pay

## 2023-04-14 NOTE — Telephone Encounter (Signed)
Pharmacy Patient Advocate Encounter  Received notification from [facility] that Prior Authorization for repatha has been APPROVED from 04/13/23 to 04/12/24. Ran test claim, Copay is $24.99. This test claim was processed through [facility]- copay amounts may vary at other pharmacies due to pharmacy/plan contracts, or as the patient moves through the different stages of their insurance plan.   PA #/Case ID/Reference #: [id number]

## 2023-04-22 DIAGNOSIS — K4090 Unilateral inguinal hernia, without obstruction or gangrene, not specified as recurrent: Secondary | ICD-10-CM | POA: Diagnosis not present

## 2023-04-22 DIAGNOSIS — D176 Benign lipomatous neoplasm of spermatic cord: Secondary | ICD-10-CM | POA: Diagnosis not present

## 2023-04-28 DIAGNOSIS — N400 Enlarged prostate without lower urinary tract symptoms: Secondary | ICD-10-CM | POA: Diagnosis not present

## 2023-04-29 DIAGNOSIS — K4090 Unilateral inguinal hernia, without obstruction or gangrene, not specified as recurrent: Secondary | ICD-10-CM | POA: Diagnosis not present

## 2023-05-27 DIAGNOSIS — K4090 Unilateral inguinal hernia, without obstruction or gangrene, not specified as recurrent: Secondary | ICD-10-CM | POA: Diagnosis not present

## 2023-08-10 ENCOUNTER — Other Ambulatory Visit: Payer: Self-pay

## 2023-08-10 DIAGNOSIS — F418 Other specified anxiety disorders: Secondary | ICD-10-CM | POA: Diagnosis not present

## 2023-08-10 DIAGNOSIS — M25512 Pain in left shoulder: Secondary | ICD-10-CM | POA: Diagnosis not present

## 2023-08-10 DIAGNOSIS — Z7982 Long term (current) use of aspirin: Secondary | ICD-10-CM

## 2023-08-10 DIAGNOSIS — E785 Hyperlipidemia, unspecified: Secondary | ICD-10-CM | POA: Diagnosis present

## 2023-08-10 DIAGNOSIS — M25511 Pain in right shoulder: Secondary | ICD-10-CM | POA: Diagnosis present

## 2023-08-10 DIAGNOSIS — Z8249 Family history of ischemic heart disease and other diseases of the circulatory system: Secondary | ICD-10-CM | POA: Diagnosis not present

## 2023-08-10 DIAGNOSIS — Z79899 Other long term (current) drug therapy: Secondary | ICD-10-CM

## 2023-08-10 DIAGNOSIS — K5730 Diverticulosis of large intestine without perforation or abscess without bleeding: Secondary | ICD-10-CM | POA: Diagnosis not present

## 2023-08-10 DIAGNOSIS — Z800 Family history of malignant neoplasm of digestive organs: Secondary | ICD-10-CM

## 2023-08-10 DIAGNOSIS — K56609 Unspecified intestinal obstruction, unspecified as to partial versus complete obstruction: Secondary | ICD-10-CM | POA: Diagnosis not present

## 2023-08-10 DIAGNOSIS — U071 COVID-19: Secondary | ICD-10-CM | POA: Diagnosis not present

## 2023-08-10 DIAGNOSIS — Z1152 Encounter for screening for COVID-19: Secondary | ICD-10-CM | POA: Diagnosis not present

## 2023-08-10 DIAGNOSIS — I10 Essential (primary) hypertension: Secondary | ICD-10-CM | POA: Diagnosis present

## 2023-08-10 DIAGNOSIS — K630 Abscess of intestine: Secondary | ICD-10-CM | POA: Diagnosis not present

## 2023-08-10 DIAGNOSIS — K651 Peritoneal abscess: Secondary | ICD-10-CM | POA: Diagnosis present

## 2023-08-10 DIAGNOSIS — K4090 Unilateral inguinal hernia, without obstruction or gangrene, not specified as recurrent: Secondary | ICD-10-CM | POA: Diagnosis not present

## 2023-08-10 DIAGNOSIS — K631 Perforation of intestine (nontraumatic): Secondary | ICD-10-CM | POA: Diagnosis present

## 2023-08-10 DIAGNOSIS — Z87891 Personal history of nicotine dependence: Secondary | ICD-10-CM

## 2023-08-10 DIAGNOSIS — R1084 Generalized abdominal pain: Secondary | ICD-10-CM | POA: Diagnosis not present

## 2023-08-10 LAB — CBC
HCT: 43 % (ref 39.0–52.0)
Hemoglobin: 14.6 g/dL (ref 13.0–17.0)
MCH: 30.8 pg (ref 26.0–34.0)
MCHC: 34 g/dL (ref 30.0–36.0)
MCV: 90.7 fL (ref 80.0–100.0)
Platelets: 270 K/uL (ref 150–400)
RBC: 4.74 MIL/uL (ref 4.22–5.81)
RDW: 11.9 % (ref 11.5–15.5)
WBC: 10.5 K/uL (ref 4.0–10.5)
nRBC: 0 % (ref 0.0–0.2)

## 2023-08-10 LAB — COMPREHENSIVE METABOLIC PANEL WITH GFR
ALT: 37 U/L (ref 0–44)
AST: 31 U/L (ref 15–41)
Albumin: 3.8 g/dL (ref 3.5–5.0)
Alkaline Phosphatase: 73 U/L (ref 38–126)
Anion gap: 14 (ref 5–15)
BUN: 16 mg/dL (ref 6–20)
CO2: 26 mmol/L (ref 22–32)
Calcium: 9.1 mg/dL (ref 8.9–10.3)
Chloride: 96 mmol/L — ABNORMAL LOW (ref 98–111)
Creatinine, Ser: 0.93 mg/dL (ref 0.61–1.24)
GFR, Estimated: >60 mL/min
Glucose, Bld: 115 mg/dL — ABNORMAL HIGH (ref 70–99)
Potassium: 3.8 mmol/L (ref 3.5–5.1)
Sodium: 136 mmol/L (ref 135–145)
Total Bilirubin: 1.3 mg/dL — ABNORMAL HIGH (ref 0.0–1.2)
Total Protein: 7.7 g/dL (ref 6.5–8.1)

## 2023-08-10 LAB — RESP PANEL BY RT-PCR (RSV, FLU A&B, COVID)  RVPGX2
Influenza A by PCR: NEGATIVE
Influenza B by PCR: NEGATIVE
Resp Syncytial Virus by PCR: NEGATIVE
SARS Coronavirus 2 by RT PCR: NEGATIVE

## 2023-08-10 LAB — LIPASE, BLOOD: Lipase: 22 U/L (ref 11–51)

## 2023-08-10 NOTE — ED Triage Notes (Signed)
Pt is covid pos, and is now having severe stomach cramps. Very little PO intake since Friday. Pt called PCP for nausea meds that only helped a small amount. Pt unable to keep anything down with antiemetic. Denies diarrhea.

## 2023-08-11 ENCOUNTER — Emergency Department (HOSPITAL_COMMUNITY): Payer: BC Managed Care – PPO | Admitting: Anesthesiology

## 2023-08-11 ENCOUNTER — Emergency Department (HOSPITAL_BASED_OUTPATIENT_CLINIC_OR_DEPARTMENT_OTHER): Payer: BC Managed Care – PPO

## 2023-08-11 ENCOUNTER — Inpatient Hospital Stay (HOSPITAL_BASED_OUTPATIENT_CLINIC_OR_DEPARTMENT_OTHER)
Admission: EM | Admit: 2023-08-11 | Discharge: 2023-08-16 | DRG: 356 | Disposition: A | Discharge status: Home / Self Care | Payer: BC Managed Care – PPO | Attending: Surgery | Admitting: Surgery

## 2023-08-11 ENCOUNTER — Encounter (HOSPITAL_BASED_OUTPATIENT_CLINIC_OR_DEPARTMENT_OTHER): Payer: Self-pay | Admitting: Radiology

## 2023-08-11 ENCOUNTER — Encounter (HOSPITAL_COMMUNITY): Admission: EM | Disposition: A | Discharge status: Home / Self Care | Payer: Self-pay | Source: Home / Self Care

## 2023-08-11 ENCOUNTER — Inpatient Hospital Stay: Admit: 2023-08-11 | Payer: BC Managed Care – PPO | Admitting: Surgery

## 2023-08-11 DIAGNOSIS — K631 Perforation of intestine (nontraumatic): Secondary | ICD-10-CM

## 2023-08-11 DIAGNOSIS — M25511 Pain in right shoulder: Secondary | ICD-10-CM | POA: Diagnosis present

## 2023-08-11 DIAGNOSIS — E785 Hyperlipidemia, unspecified: Secondary | ICD-10-CM | POA: Diagnosis not present

## 2023-08-11 DIAGNOSIS — Z7982 Long term (current) use of aspirin: Secondary | ICD-10-CM | POA: Diagnosis not present

## 2023-08-11 DIAGNOSIS — K651 Peritoneal abscess: Secondary | ICD-10-CM | POA: Diagnosis not present

## 2023-08-11 DIAGNOSIS — K56609 Unspecified intestinal obstruction, unspecified as to partial versus complete obstruction: Secondary | ICD-10-CM | POA: Diagnosis present

## 2023-08-11 DIAGNOSIS — M25512 Pain in left shoulder: Secondary | ICD-10-CM | POA: Diagnosis present

## 2023-08-11 DIAGNOSIS — Z87891 Personal history of nicotine dependence: Secondary | ICD-10-CM | POA: Diagnosis not present

## 2023-08-11 DIAGNOSIS — Z79899 Other long term (current) drug therapy: Secondary | ICD-10-CM | POA: Diagnosis not present

## 2023-08-11 DIAGNOSIS — Z8249 Family history of ischemic heart disease and other diseases of the circulatory system: Secondary | ICD-10-CM | POA: Diagnosis not present

## 2023-08-11 DIAGNOSIS — Z800 Family history of malignant neoplasm of digestive organs: Secondary | ICD-10-CM | POA: Diagnosis not present

## 2023-08-11 DIAGNOSIS — F418 Other specified anxiety disorders: Secondary | ICD-10-CM | POA: Diagnosis not present

## 2023-08-11 DIAGNOSIS — I10 Essential (primary) hypertension: Secondary | ICD-10-CM | POA: Diagnosis present

## 2023-08-11 DIAGNOSIS — Z1152 Encounter for screening for COVID-19: Secondary | ICD-10-CM | POA: Diagnosis not present

## 2023-08-11 HISTORY — PX: LAPAROTOMY: SHX154

## 2023-08-11 LAB — URINALYSIS, ROUTINE W REFLEX MICROSCOPIC
Glucose, UA: NEGATIVE mg/dL
Ketones, ur: >=80 mg/dL — AB
Leukocytes,Ua: NEGATIVE
Nitrite: NEGATIVE
Protein, ur: 100 mg/dL — AB
Specific Gravity, Urine: >=1.03 (ref 1.005–1.030)
pH: 6 (ref 5.0–8.0)

## 2023-08-11 LAB — URINALYSIS, MICROSCOPIC (REFLEX)

## 2023-08-11 LAB — TYPE AND SCREEN
ABO/RH(D): O POS
Antibody Screen: NEGATIVE

## 2023-08-11 LAB — SURGICAL PCR SCREEN
MRSA, PCR: NEGATIVE
Staphylococcus aureus: NEGATIVE

## 2023-08-11 LAB — ABO/RH: ABO/RH(D): O POS

## 2023-08-11 SURGERY — LAPAROTOMY, EXPLORATORY
Anesthesia: General

## 2023-08-11 MED ORDER — CHLORHEXIDINE GLUCONATE 0.12 % MT SOLN
15.0000 mL | Freq: Once | OROMUCOSAL | Status: AC
  Administered 2023-08-11: 15 mL via OROMUCOSAL

## 2023-08-11 MED ORDER — METHOCARBAMOL 1000 MG/10ML IJ SOLN
500.0000 mg | Freq: Three times a day (TID) | INTRAMUSCULAR | Status: DC
  Administered 2023-08-11 – 2023-08-14 (×8): 500 mg via INTRAVENOUS
  Filled 2023-08-11 (×10): qty 10

## 2023-08-11 MED ORDER — LACTATED RINGERS IV BOLUS
1000.0000 mL | Freq: Once | INTRAVENOUS | Status: AC
  Administered 2023-08-11: 1000 mL via INTRAVENOUS

## 2023-08-11 MED ORDER — HYDRALAZINE HCL 20 MG/ML IJ SOLN: 10.0000 mg | INTRAMUSCULAR | Status: DC | PRN

## 2023-08-11 MED ORDER — ENOXAPARIN SODIUM 40 MG/0.4ML IJ SOSY
40.0000 mg | PREFILLED_SYRINGE | INTRAMUSCULAR | Status: DC
  Administered 2023-08-12 – 2023-08-16 (×5): 40 mg via SUBCUTANEOUS
  Filled 2023-08-11 (×5): qty 0.4

## 2023-08-11 MED ORDER — PIPERACILLIN-TAZOBACTAM 3.375 G IVPB
3.3750 g | Freq: Three times a day (TID) | INTRAVENOUS | Status: DC
  Administered 2023-08-11: 3.375 g via INTRAVENOUS
  Filled 2023-08-11: qty 50

## 2023-08-11 MED ORDER — PIPERACILLIN-TAZOBACTAM 3.375 G IVPB: 3.3750 g | Freq: Three times a day (TID) | INTRAVENOUS | Status: DC

## 2023-08-11 MED ORDER — PIPERACILLIN-TAZOBACTAM 3.375 G IVPB
3.3750 g | Freq: Three times a day (TID) | INTRAVENOUS | Status: DC
  Administered 2023-08-11 – 2023-08-16 (×14): 3.375 g via INTRAVENOUS
  Filled 2023-08-11 (×14): qty 50

## 2023-08-11 MED ORDER — FENTANYL CITRATE PF 50 MCG/ML IJ SOSY
PREFILLED_SYRINGE | INTRAMUSCULAR | Status: AC
  Administered 2023-08-11: 25 ug via INTRAVENOUS
  Filled 2023-08-11: qty 1

## 2023-08-11 MED ORDER — ONDANSETRON HCL 4 MG/2ML IJ SOLN
INTRAMUSCULAR | Status: AC
  Filled 2023-08-11: qty 2

## 2023-08-11 MED ORDER — ROCURONIUM BROMIDE 100 MG/10ML IV SOLN
INTRAVENOUS | Status: DC | PRN
  Administered 2023-08-11: 60 mg via INTRAVENOUS

## 2023-08-11 MED ORDER — MIDAZOLAM HCL 2 MG/2ML IJ SOLN
INTRAMUSCULAR | Status: AC
  Filled 2023-08-11: qty 2

## 2023-08-11 MED ORDER — DIPHENHYDRAMINE HCL 25 MG PO CAPS: 25.0000 mg | ORAL_CAPSULE | Freq: Four times a day (QID) | ORAL | Status: DC | PRN

## 2023-08-11 MED ORDER — ACETAMINOPHEN 10 MG/ML IV SOLN
1000.0000 mg | Freq: Four times a day (QID) | INTRAVENOUS | Status: AC
Start: 2023-08-11 — End: 2023-08-12
  Administered 2023-08-11 – 2023-08-12 (×4): 1000 mg via INTRAVENOUS
  Filled 2023-08-11 (×3): qty 100

## 2023-08-11 MED ORDER — METOCLOPRAMIDE HCL 5 MG/ML IJ SOLN
10.0000 mg | Freq: Once | INTRAMUSCULAR | Status: AC
  Administered 2023-08-11: 10 mg via INTRAVENOUS
  Filled 2023-08-11: qty 2

## 2023-08-11 MED ORDER — SUGAMMADEX SODIUM 200 MG/2ML IV SOLN
INTRAVENOUS | Status: DC | PRN
  Administered 2023-08-11: 200 mg via INTRAVENOUS

## 2023-08-11 MED ORDER — PIPERACILLIN-TAZOBACTAM 3.375 G IVPB 30 MIN
3.3750 g | Freq: Once | INTRAVENOUS | Status: AC
  Administered 2023-08-11: 3.375 g via INTRAVENOUS
  Filled 2023-08-11: qty 50

## 2023-08-11 MED ORDER — ONDANSETRON HCL 4 MG/2ML IJ SOLN: 4.0000 mg | Freq: Once | INTRAMUSCULAR | Status: DC | PRN

## 2023-08-11 MED ORDER — SODIUM CHLORIDE 0.9 % IV SOLN: INTRAVENOUS | Status: AC | PRN

## 2023-08-11 MED ORDER — ACETAMINOPHEN 500 MG PO TABS
1000.0000 mg | ORAL_TABLET | Freq: Four times a day (QID) | ORAL | Status: DC
  Filled 2023-08-11: qty 2

## 2023-08-11 MED ORDER — OXYCODONE HCL 5 MG PO TABS: 5.0000 mg | ORAL_TABLET | Freq: Once | ORAL | Status: DC | PRN

## 2023-08-11 MED ORDER — PROCHLORPERAZINE EDISYLATE 10 MG/2ML IJ SOLN
5.0000 mg | Freq: Four times a day (QID) | INTRAMUSCULAR | Status: DC | PRN
  Administered 2023-08-11: 5 mg via INTRAVENOUS
  Administered 2023-08-12 – 2023-08-14 (×3): 10 mg via INTRAVENOUS
  Administered 2023-08-16: 5 mg via INTRAVENOUS
  Filled 2023-08-11 (×6): qty 2

## 2023-08-11 MED ORDER — IOHEXOL 300 MG/ML  SOLN
100.0000 mL | Freq: Once | INTRAMUSCULAR | Status: AC | PRN
  Administered 2023-08-11: 100 mL via INTRAVENOUS

## 2023-08-11 MED ORDER — ONDANSETRON HCL 4 MG/2ML IJ SOLN
INTRAMUSCULAR | Status: DC | PRN
  Administered 2023-08-11: 4 mg via INTRAVENOUS

## 2023-08-11 MED ORDER — ACETAMINOPHEN 160 MG/5ML PO SOLN: 325.0000 mg | ORAL | Status: DC | PRN

## 2023-08-11 MED ORDER — SUCCINYLCHOLINE CHLORIDE 200 MG/10ML IV SOSY
PREFILLED_SYRINGE | INTRAVENOUS | Status: DC | PRN
  Administered 2023-08-11: 100 mg via INTRAVENOUS

## 2023-08-11 MED ORDER — ROCURONIUM BROMIDE 10 MG/ML (PF) SYRINGE
PREFILLED_SYRINGE | INTRAVENOUS | Status: AC
  Filled 2023-08-11: qty 10

## 2023-08-11 MED ORDER — SODIUM CHLORIDE 0.9 % IR SOLN
Status: DC | PRN
  Administered 2023-08-11 (×2): 1000 mL

## 2023-08-11 MED ORDER — LIDOCAINE HCL (CARDIAC) PF 100 MG/5ML IV SOSY
PREFILLED_SYRINGE | INTRAVENOUS | Status: DC | PRN
  Administered 2023-08-11: 80 mg via INTRAVENOUS

## 2023-08-11 MED ORDER — LIDOCAINE HCL (PF) 2 % IJ SOLN
INTRAMUSCULAR | Status: AC
  Filled 2023-08-11: qty 5

## 2023-08-11 MED ORDER — LACTATED RINGERS IV SOLN: INTRAVENOUS | Status: DC | PRN

## 2023-08-11 MED ORDER — PROCHLORPERAZINE MALEATE 10 MG PO TABS: 10.0000 mg | ORAL_TABLET | Freq: Four times a day (QID) | ORAL | Status: DC | PRN

## 2023-08-11 MED ORDER — FENTANYL CITRATE PF 50 MCG/ML IJ SOSY
25.0000 ug | PREFILLED_SYRINGE | INTRAMUSCULAR | Status: DC | PRN
  Administered 2023-08-11 (×2): 25 ug via INTRAVENOUS

## 2023-08-11 MED ORDER — MORPHINE SULFATE (PF) 4 MG/ML IV SOLN
4.0000 mg | Freq: Once | INTRAVENOUS | Status: AC
  Administered 2023-08-11: 4 mg via INTRAVENOUS
  Filled 2023-08-11: qty 1

## 2023-08-11 MED ORDER — HYDROMORPHONE HCL 2 MG/ML IJ SOLN
INTRAMUSCULAR | Status: AC
  Filled 2023-08-11: qty 1

## 2023-08-11 MED ORDER — DIPHENHYDRAMINE HCL 50 MG/ML IJ SOLN: 25.0000 mg | Freq: Four times a day (QID) | INTRAMUSCULAR | Status: DC | PRN

## 2023-08-11 MED ORDER — ACETAMINOPHEN 10 MG/ML IV SOLN
INTRAVENOUS | Status: AC
  Filled 2023-08-11: qty 100

## 2023-08-11 MED ORDER — MIDAZOLAM HCL 5 MG/5ML IJ SOLN
INTRAMUSCULAR | Status: DC | PRN
  Administered 2023-08-11: 2 mg via INTRAVENOUS

## 2023-08-11 MED ORDER — OXYCODONE HCL 5 MG/5ML PO SOLN: 5.0000 mg | Freq: Once | ORAL | Status: DC | PRN

## 2023-08-11 MED ORDER — FENTANYL CITRATE (PF) 100 MCG/2ML IJ SOLN
INTRAMUSCULAR | Status: AC
  Filled 2023-08-11: qty 2

## 2023-08-11 MED ORDER — SODIUM CHLORIDE 0.9 % IV BOLUS
1000.0000 mL | Freq: Once | INTRAVENOUS | Status: AC
  Administered 2023-08-11: 1000 mL via INTRAVENOUS

## 2023-08-11 MED ORDER — DEXAMETHASONE SODIUM PHOSPHATE 10 MG/ML IJ SOLN
INTRAMUSCULAR | Status: AC
  Filled 2023-08-11: qty 1

## 2023-08-11 MED ORDER — ONDANSETRON HCL 4 MG/2ML IJ SOLN
4.0000 mg | Freq: Four times a day (QID) | INTRAMUSCULAR | Status: DC | PRN
  Administered 2023-08-12 – 2023-08-14 (×4): 4 mg via INTRAVENOUS
  Filled 2023-08-11 (×4): qty 2

## 2023-08-11 MED ORDER — MEPERIDINE HCL 50 MG/ML IJ SOLN: 6.2500 mg | INTRAMUSCULAR | Status: DC | PRN

## 2023-08-11 MED ORDER — MORPHINE SULFATE (PF) 2 MG/ML IV SOLN
1.0000 mg | INTRAVENOUS | Status: DC | PRN
Start: 2023-08-11 — End: 2023-08-16
  Administered 2023-08-11 – 2023-08-13 (×5): 2 mg via INTRAVENOUS
  Filled 2023-08-11 (×6): qty 1

## 2023-08-11 MED ORDER — PROPOFOL 10 MG/ML IV BOLUS
INTRAVENOUS | Status: AC
  Filled 2023-08-11: qty 20

## 2023-08-11 MED ORDER — SUCCINYLCHOLINE CHLORIDE 200 MG/10ML IV SOSY
PREFILLED_SYRINGE | INTRAVENOUS | Status: AC
  Filled 2023-08-11: qty 10

## 2023-08-11 MED ORDER — PROPOFOL 10 MG/ML IV BOLUS
INTRAVENOUS | Status: DC | PRN
  Administered 2023-08-11: 160 mg via INTRAVENOUS

## 2023-08-11 MED ORDER — HYDROMORPHONE HCL 1 MG/ML IJ SOLN
INTRAMUSCULAR | Status: DC | PRN
  Administered 2023-08-11: .5 mg via INTRAVENOUS
  Administered 2023-08-11: 1 mg via INTRAVENOUS
  Administered 2023-08-11: .5 mg via INTRAVENOUS

## 2023-08-11 MED ORDER — KETOROLAC TROMETHAMINE 15 MG/ML IJ SOLN
15.0000 mg | Freq: Four times a day (QID) | INTRAMUSCULAR | Status: DC
  Administered 2023-08-11 – 2023-08-16 (×18): 15 mg via INTRAVENOUS
  Filled 2023-08-11 (×19): qty 1

## 2023-08-11 MED ORDER — ONDANSETRON HCL 4 MG/2ML IJ SOLN
4.0000 mg | Freq: Once | INTRAMUSCULAR | Status: AC
  Administered 2023-08-11: 4 mg via INTRAVENOUS
  Filled 2023-08-11: qty 2

## 2023-08-11 MED ORDER — DEXAMETHASONE SODIUM PHOSPHATE 10 MG/ML IJ SOLN
INTRAMUSCULAR | Status: DC | PRN
  Administered 2023-08-11: 8 mg via INTRAVENOUS

## 2023-08-11 MED ORDER — PHENYLEPHRINE HCL-NACL 20-0.9 MG/250ML-% IV SOLN
INTRAVENOUS | Status: AC
  Filled 2023-08-11: qty 250

## 2023-08-11 MED ORDER — ONDANSETRON 4 MG PO TBDP: 4.0000 mg | ORAL_TABLET | Freq: Four times a day (QID) | ORAL | Status: DC | PRN

## 2023-08-11 MED ORDER — FENTANYL CITRATE (PF) 100 MCG/2ML IJ SOLN
INTRAMUSCULAR | Status: DC | PRN
  Administered 2023-08-11: 100 ug via INTRAVENOUS

## 2023-08-11 MED ORDER — ACETAMINOPHEN 325 MG PO TABS: 325.0000 mg | ORAL_TABLET | ORAL | Status: DC | PRN

## 2023-08-11 MED ORDER — KCL IN DEXTROSE-NACL 20-5-0.45 MEQ/L-%-% IV SOLN
INTRAVENOUS | Status: AC
  Filled 2023-08-11: qty 1000

## 2023-08-11 MED ORDER — LACTATED RINGERS IV SOLN: INTRAVENOUS | Status: DC

## 2023-08-11 MED ORDER — KCL IN DEXTROSE-NACL 20-5-0.45 MEQ/L-%-% IV SOLN
INTRAVENOUS | Status: DC
  Filled 2023-08-11 (×3): qty 1000

## 2023-08-11 SURGICAL SUPPLY — 36 items
APPLICATOR COTTON TIP 6 STRL (MISCELLANEOUS) ×1 IMPLANT
APPLICATOR COTTON TIP 6IN STRL (MISCELLANEOUS) ×1 IMPLANT
BAG COUNTER SPONGE SURGICOUNT (BAG) IMPLANT
BIOPATCH RED 1 DISK 7.0 (GAUZE/BANDAGES/DRESSINGS) IMPLANT
BLADE HEX COATED 2.75 (ELECTRODE) ×1 IMPLANT
CHLORAPREP W/TINT 26 (MISCELLANEOUS) ×1 IMPLANT
COVER MAYO STAND STRL (DRAPES) IMPLANT
COVER SURGICAL LIGHT HANDLE (MISCELLANEOUS) ×1 IMPLANT
DRAPE LAPAROSCOPIC ABDOMINAL (DRAPES) ×1 IMPLANT
DRAPE WARM FLUID 44X44 (DRAPES) IMPLANT
DRSG OPSITE POSTOP 4X12 (GAUZE/BANDAGES/DRESSINGS) IMPLANT
DRSG TEGADERM 2-3/8X2-3/4 SM (GAUZE/BANDAGES/DRESSINGS) IMPLANT
ELECT REM PT RETURN 15FT ADLT (MISCELLANEOUS) ×1 IMPLANT
EVACUATOR SILICONE 100CC (DRAIN) IMPLANT
GAUZE SPONGE 2X2 STRL 8-PLY (GAUZE/BANDAGES/DRESSINGS) IMPLANT
GAUZE SPONGE 4X4 12PLY STRL (GAUZE/BANDAGES/DRESSINGS) ×1 IMPLANT
GLOVE SURG ORTHO 8.0 STRL STRW (GLOVE) ×1 IMPLANT
GOWN STRL REUS W/ TWL XL LVL3 (GOWN DISPOSABLE) ×2 IMPLANT
HANDLE SUCTION POOLE (INSTRUMENTS) IMPLANT
KIT BASIN OR (CUSTOM PROCEDURE TRAY) ×1 IMPLANT
KIT TURNOVER KIT A (KITS) IMPLANT
NS IRRIG 1000ML POUR BTL (IV SOLUTION) ×1 IMPLANT
PACK GENERAL/GYN (CUSTOM PROCEDURE TRAY) ×1 IMPLANT
STAPLER SKIN PROX WIDE 3.9 (STAPLE) ×1 IMPLANT
SUCTION POOLE HANDLE (INSTRUMENTS) IMPLANT
SUT ETHILON 2 0 PS N (SUTURE) IMPLANT
SUT NOV 1 T60/GS (SUTURE) IMPLANT
SUT SILK 2 0 SH CR/8 (SUTURE) IMPLANT
SUT SILK 2-0 18XBRD TIE 12 (SUTURE) IMPLANT
SUT SILK 3 0 12 30 (SUTURE) IMPLANT
SUT SILK 3 0 SH CR/8 (SUTURE) IMPLANT
SUT SILK 3-0 18XBRD TIE 12 (SUTURE) IMPLANT
TOWEL OR 17X26 10 PK STRL BLUE (TOWEL DISPOSABLE) ×2 IMPLANT
TRAY FOLEY MTR SLVR 16FR STAT (SET/KITS/TRAYS/PACK) IMPLANT
WATER STERILE IRR 1000ML POUR (IV SOLUTION) ×1 IMPLANT
YANKAUER SUCT BULB TIP NO VENT (SUCTIONS) IMPLANT

## 2023-08-11 NOTE — Anesthesia Postprocedure Evaluation (Signed)
 Anesthesia Post Note  Patient: Alexander [name]  Procedure(s) Performed: EXPLORATORY LAPAROTOMY, DRAINAGE OF PELVIC ABSCESS     Patient location during evaluation: PACU Anesthesia Type: General Level of consciousness: sedated and patient cooperative Pain management: pain level controlled Vital Signs Assessment: post-procedure vital signs reviewed and stable Respiratory status: spontaneous breathing Cardiovascular status: stable Anesthetic complications: no   No notable events documented.  Last Vitals:  Vitals:   08/11/23 1530 08/11/23 1545  BP: (!) 158/92 (!) 156/101  Pulse: (!) 110 (!) 107  Resp:    Temp:  37.1 C  SpO2: 96% 95%    Last Pain:  Vitals:   08/11/23 1500  TempSrc:   PainSc: 5                  [name]

## 2023-08-11 NOTE — ED Notes (Signed)
Called [facility] @ 7:21am for immediate Transport.  Spoke with [employer].

## 2023-08-11 NOTE — Anesthesia Preprocedure Evaluation (Addendum)
Anesthesia Evaluation  Patient identified by MRN, date of birth, ID band Patient awake    Reviewed: Allergy & Precautions, H&P , NPO status , Patient's Chart, lab work & pertinent test results  Airway Mallampati: I  TM Distance: >3 FB Neck ROM: Full    Dental no notable dental hx. (+) Teeth Intact, Dental Advisory Given   Pulmonary neg pulmonary ROS   Pulmonary exam normal breath sounds clear to auscultation       Cardiovascular Exercise Tolerance: Good negative cardio ROS Normal cardiovascular exam Rhythm:Regular Rate:Normal     Neuro/Psych   Anxiety     negative neurological ROS  negative psych ROS   GI/Hepatic negative GI ROS, Neg liver ROS,GERD  Medicated,,  Endo/Other  negative endocrine ROS    Renal/GU negative Renal ROS  negative genitourinary   Musculoskeletal negative musculoskeletal ROS (+)    Abdominal   Peds negative pediatric ROS (+)  Hematology negative hematology ROS (+)   Anesthesia Other Findings   Reproductive/Obstetrics negative OB ROS                             Anesthesia Physical Anesthesia Plan  ASA: 2 and emergent  Anesthesia Plan: General   Post-op Pain Management: Minimal or no pain anticipated, Dilaudid IV and Ofirmev IV (intra-op)*   Induction: Intravenous and Cricoid pressure planned  PONV Risk Score and Plan: 2 and Ondansetron, Dexamethasone and Treatment may vary due to age or medical condition  Airway Management Planned: Oral ETT  Additional Equipment: None  Intra-op Plan:   Post-operative Plan: Extubation in OR  Informed Consent: I have reviewed the patients History and Physical, chart, labs and discussed the procedure including the risks, benefits and alternatives for the proposed anesthesia with the patient or authorized representative who has indicated his/her understanding and acceptance.       Plan Discussed with: Anesthesiologist  and CRNA  Anesthesia Plan Comments: (  )        Anesthesia Quick Evaluation

## 2023-08-11 NOTE — H&P (Signed)
 Alexander [name] 1971-05-06  [id number].    Chief Complaint/Reason for Consult: abdominal pain  HPI:  This is a very pleasant 53 yo with a history of HLD who began having central lower abdominal pain on Friday.  He has never had any pain like this before.  It progressed and he had sharp pain radiating to his B shoulders on Saturday.  He had a temp of 102, but then no further fevers since that time.  He has had some nausea and dry heaving, but no vomiting.  He had a BM yesterday with no blood present.  He does Cologuard, but no colonoscopies.  He denies eating anything with bones, chewing on toothpicks, or a wired grill brush recently.  His pain persisted through Sunday and he went to the ED on Monday where he was found to have a small bowel perforation, unclear etiology.   His labs were all relatively unremarkable.  He has been transferred here for further surgical evaluation.  His mother passed away from gastric cancer. No other intra-abdominal malignancy FH, no IBD, IBS, night sweats, unintentional weight loss, fevers, etc.  ROS: ROS: see HPI  Family History  Problem Relation Age of Onset   Sudden death Father        sudeen cardiac death in 82s   Cancer Mother        abdominal ca   Heart attack Maternal Grandfather     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(HCC)    Hyperlipidemia     Past Surgical History:  Procedure Laterality Date   deviated septal surgery  07/08/1995   HERNIA REPAIR  07/07/1997    Social History:  reports that he has never smoked. He has quit using smokeless tobacco.  His smokeless tobacco use included snuff. He reports that he does not drink alcohol  and does not use drugs.  Allergies: No Known Allergies  Medications Prior to Admission  Medication Sig Dispense Refill   aspirin 81 MG chewable tablet Chew by mouth daily.     atorvastatin (LIPITOR) 40 MG  tablet Take 1 tablet by mouth daily.     Coenzyme Q10 (COQ10) 200 MG CAPS Take 1 tablet by mouth daily.     REPATHA  SURECLICK 140 MG/ML SOAJ ADMINISTER 1 ML UNDER THE SKIN EVERY 14 DAYS 2 mL 11     Physical Exam: Blood pressure (!) 140/99, pulse 96, temperature 98.2 F (36.8 C), temperature source Oral, resp. rate 18, height 6' 2 (1.88 m), weight 86.2 kg, SpO2 99%. General: pleasant, WD, WN white male who is laying in bed in NAD HEENT: head is normocephalic, atraumatic.  Sclera are noninjected.  PERRL.  Ears and nose without any masses or lesions.  Mouth is pink and moist Heart: regular, rate, and rhythm.  Normal s1,s2. No obvious murmurs, gallops, or rubs noted.  Palpable radial and pedal pulses bilaterally Lungs: CTAB, no wheezes, rhonchi, or rales noted.  Respiratory effort nonlabored Abd: soft, but distended, diffusely tender but greatest in central lower abdomen.  No overt peritonitis, +BS, no masses, hernias, or organomegaly MS: all 4 extremities are symmetrical with no cyanosis, clubbing, or edema. Skin: warm and dry with no masses, lesions, or rashes Psych: A&Ox3 with an appropriate affect.   Results for orders placed or performed during the hospital encounter of 08/11/23 (from the past 48 hours)  Urinalysis, Routine  w reflex microscopic -Urine, Clean Catch     Status: Abnormal   Collection Time: 08/10/23  2:20 AM  Result Value Ref Range   Color, Urine YELLOW YELLOW   APPearance HAZY (A) CLEAR   Specific Gravity, Urine >=1.030 1.005 - 1.030   pH 6.0 5.0 - 8.0   Glucose, UA NEGATIVE NEGATIVE mg/dL   Hgb urine dipstick SMALL (A) NEGATIVE   Bilirubin Urine MODERATE (A) NEGATIVE   Ketones, ur >=80 (A) NEGATIVE mg/dL   Protein, ur 899 (A) NEGATIVE mg/dL   Nitrite NEGATIVE NEGATIVE   Leukocytes,Ua NEGATIVE NEGATIVE    Comment: Performed at [facility], [address] [place], [place] [address]  Urinalysis, Microscopic (reflex)     Status: Abnormal   Collection  Time: 08/10/23  2:20 AM  Result Value Ref Range   RBC / HPF 0-5 0 - 5 RBC/hpf   WBC, UA 0-5 0 - 5 WBC/hpf   Bacteria, UA FEW (A) NONE SEEN   Squamous Epithelial / HPF 0-5 0 - 5 /HPF   Mucus PRESENT    Granular Casts, UA PRESENT     Comment: Performed at [facility], [address] [place], [place] [address]  Resp panel by RT-PCR (RSV, Flu A&B, Covid) Anterior Nasal Swab     Status: None   Collection Time: 08/10/23  7:25 PM   Specimen: Anterior Nasal Swab  Result Value Ref Range   SARS Coronavirus 2 by RT PCR NEGATIVE NEGATIVE    Comment: (NOTE) SARS-CoV-2 target nucleic acids are NOT DETECTED.  The SARS-CoV-2 RNA is generally detectable in upper respiratory specimens during the acute phase of infection. The lowest concentration of SARS-CoV-2 viral copies this assay can detect is 138 copies/mL. A negative result does not preclude SARS-Cov-2 infection and should not be used as the sole basis for treatment or other patient management decisions. A negative result may occur with  improper specimen collection/handling, submission of specimen other than nasopharyngeal swab, presence of viral mutation(s) within the areas targeted by this assay, and inadequate number of viral copies(<138 copies/mL). A negative result must be combined with clinical observations, patient history, and epidemiological information. The expected result is Negative.  Fact Sheet for Patients:  bloggercourse.com  Fact Sheet for Healthcare Providers:  seriousbroker.it  This test is no t yet approved or cleared by the [place]  FDA and  has been authorized for detection and/or diagnosis of SARS-CoV-2 by FDA under an Emergency Use Authorization (EUA). This EUA will remain  in effect (meaning this test can be used) for the duration of the COVID-19 declaration under Section 564(b)(1) of the Act, 21 U.S.C.section 360bbb-3(b)(1), unless the  authorization is terminated  or revoked sooner.       Influenza A by PCR NEGATIVE NEGATIVE   Influenza B by PCR NEGATIVE NEGATIVE    Comment: (NOTE) The Xpert Xpress SARS-CoV-2/FLU/RSV plus assay is intended as an aid in the diagnosis of influenza from Nasopharyngeal swab specimens and should not be used as a sole basis for treatment. Nasal washings and aspirates are unacceptable for Xpert Xpress SARS-CoV-2/FLU/RSV testing.  Fact Sheet for Patients: bloggercourse.com  Fact Sheet for Healthcare Providers: seriousbroker.it  This test is not yet approved or cleared by the [place]  FDA and has been authorized for detection and/or diagnosis of SARS-CoV-2 by FDA under an Emergency Use Authorization (EUA). This EUA will remain in effect (meaning this test can be used) for the duration of the COVID-19 declaration under Section  564(b)(1) of the Act, 21 U.S.C. section 360bbb-3(b)(1), unless the authorization is terminated or revoked.     Resp Syncytial Virus by PCR NEGATIVE NEGATIVE    Comment: (NOTE) Fact Sheet for Patients: bloggercourse.com  Fact Sheet for Healthcare Providers: seriousbroker.it  This test is not yet approved or cleared by the [place]  FDA and has been authorized for detection and/or diagnosis of SARS-CoV-2 by FDA under an Emergency Use Authorization (EUA). This EUA will remain in effect (meaning this test can be used) for the duration of the COVID-19 declaration under Section 564(b)(1) of the Act, 21 U.S.C. section 360bbb-3(b)(1), unless the authorization is terminated or revoked.  Performed at [facility], [address] [place], [place] [address]   Lipase, blood     Status: None   Collection Time: 08/10/23  7:25 PM  Result Value Ref Range   Lipase 22 11 - 51 U/L    Comment: Performed at [facility], [address]  [place], [place] [address]  Comprehensive metabolic panel     Status: Abnormal   Collection Time: 08/10/23  7:25 PM  Result Value Ref Range   Sodium 136 135 - 145 mmol/L   Potassium 3.8 3.5 - 5.1 mmol/L   Chloride 96 (L) 98 - 111 mmol/L   CO2 26 22 - 32 mmol/L   Glucose, Bld 115 (H) 70 - 99 mg/dL    Comment: Glucose reference range applies only to samples taken after fasting for at least 8 hours.   BUN 16 6 - 20 mg/dL   Creatinine, Ser 9.06 0.61 - 1.24 mg/dL   Calcium 9.1 8.9 - 89.6 mg/dL   Total Protein 7.7 6.5 - 8.1 g/dL   Albumin 3.8 3.5 - 5.0 g/dL   AST 31 15 - 41 U/L   ALT 37 0 - 44 U/L   Alkaline Phosphatase 73 38 - 126 U/L   Total Bilirubin 1.3 (H) 0.0 - 1.2 mg/dL   GFR, Estimated >39 >39 mL/min    Comment: (NOTE) Calculated using the CKD-EPI Creatinine Equation (2021)    Anion gap 14 5 - 15    Comment: Performed at [facility], [address] [place], [place] [address]  CBC     Status: None   Collection Time: 08/10/23  7:25 PM  Result Value Ref Range   WBC 10.5 4.0 - 10.5 K/uL   RBC 4.74 4.22 - 5.81 MIL/uL   Hemoglobin 14.6 13.0 - 17.0 g/dL   HCT 56.9 60.9 - 47.9 %   MCV 90.7 80.0 - 100.0 fL   MCH 30.8 26.0 - 34.0 pg   MCHC 34.0 30.0 - 36.0 g/dL   RDW 88.0 88.4 - 84.4 %   Platelets 270 150 - 400 K/uL   nRBC 0.0 0.0 - 0.2 %    Comment: Performed at [facility], [address] [place], [place] [address]   CT ABDOMEN PELVIS W CONTRAST Addendum Date: 08/11/2023 ADDENDUM REPORT: 08/11/2023 06:33 ADDENDUM: Critical Value/emergent results were called by telephone at the time of interpretation on 08/11/2023 at 6:29 am to provider [facility] , who verbally acknowledged these results. Electronically Signed   By: Francis Quam M.D.   On: 08/11/2023 06:33   Result Date: 08/11/2023 CLINICAL DATA:  Abdominal pain, acute, nonlocalized. EXAM: CT ABDOMEN AND PELVIS WITH CONTRAST TECHNIQUE: Multidetector CT imaging of the abdomen and pelvis was performed  using the standard protocol following bolus administration of intravenous contrast.  RADIATION DOSE REDUCTION: This exam was performed according to the departmental dose-optimization program which includes automated exposure control, adjustment of the mA and/or kV according to patient size and/or use of iterative reconstruction technique. CONTRAST:  OMNIPAQUE  IOHEXOL  300 MG/ML  SOLN COMPARISON:  CT without contrast 03/11/2018 FINDINGS: Lower chest: Stable 4 mm right lower lobe lateral basal nodule on 302:5, consistent with a benign entity. Lung bases are clear of infiltrates.  The cardiac size is normal. Hepatobiliary: No focal liver abnormality is seen. No calcified gallstones, gallbladder wall thickening, or biliary dilatation. Pancreas: No abnormality. Spleen: No abnormality. Adrenals/Urinary Tract: There is no adrenal mass. There is a 6 mm hypodensity of the outer midpole left kidney which is too small to characterize, consistent with a Bosniak 2 cyst. No follow-up imaging is recommended. The remainder of the bilateral kidneys are homogeneous without mass enhancement. There is no urinary stone or obstruction. The bladder is unremarkable. Stomach/Bowel: Unremarkable gastric wall, duodenum and jejunum. There are dilated obstructed ileal segments in the mid to lower abdomen up to 4.8 cm. The transition to decompressed caliber appears to be approximately mid ileal judging from the number of collapsed segments downstream. The transitional segment is thickened and inflamed and there is a bowel perforation involving the diseased segment at the level of S1, with the site of perforation believed to be in the midline to the left of midline on 301: 57-58 and on 0601: 80-85. There is a collection of fluid and gas bubbles measuring 3.1 x 2.3 cm on 301:50 adjacent the perforation site which is probably an abscess beginning to form, with a slightly larger air pocket just anterior to it, and there is scattered free air in  the upper to mid abdomen as well including in the porta hepatis and lesser sac. There is sigmoid diverticulosis. The proximal third of the sigmoid directly underlies the inflamed small bowel segment and is also thickened, unknown if this is due to coexisting diverticulitis or reactive in etiology. At present I do not see any interloop fistulization. Rest of the bowel is unremarkable. Vascular/Lymphatic: Aortic atherosclerosis. No enlarged abdominal or pelvic lymph nodes. Reproductive: Prostatomegaly, prostate transverse axis 4.9 cm. Both testicles are in the scrotal sac. Other: In addition to the free air there is minimal pelvic free fluid likely reactive. No incarcerated hernia. Small left inguinal fat hernia Musculoskeletal: Degenerative disc disease and spondylosis L5-S1. 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 No acute or other significant osseous findings. IMPRESSION: 1. Small-bowel obstruction with transition point in approximately the mid ileum, with a perforation involving the thickened transitional segment at the level of S1, with the site of perforation believed to be in the midline to the left of midline. 2. 3.1 x 2.3 cm collection of fluid and gas bubbles adjacent the perforation site which is probably an abscess beginning to form, with a slightly larger air pocket just anterior to it. 3. Scattered free air in the upper to mid abdomen and minimal pelvic free fluid likely reactive. 4. The diverticulous proximal third of the sigmoid directly underlies the inflamed small bowel segment and is also thickened, unknown if this is due to coexisting diverticulitis or reactive in etiology. 5. Aortic atherosclerosis. 6. Prostatomegaly. 7. Small left inguinal fat hernia. 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 9. PRA is attempting to reach Dr. Haze for stat notification regarding the above. This report will be addended once contact has been made. Aortic Atherosclerosis (ICD10-I70.0). Electronically  Signed: By: Francis Beatriz HERO.D.  On: 08/11/2023 06:24      Assessment/Plan Small bowel perforation, unclear etiology The patient has been seen, examined, labs, vitals, chart, and imaging personally reviewed.  He appears to have small bowel that is very inflamed/thickened and evidence of perforation and development of air/fluid/phlegmonous area.  The patient would benefit from operative intervention at this point as there is really not much that could be conservatively drained with the expectation that he will resolve this situation.  We discussed the procedure as ex lap with expected SBR, possible ostomy, open wound, etc.  We discussed risks and complications including, but not limited to bleeding, infection, post op drain for infection, DVTs/blood clots, hernia, ileus, etc.  He and his wife understand and all questions have been answered to the best of my ability.   FEN - NPO, suspect NGT post op, IVFs VTE - SCDs, will start lovenox  tomorrow ID - [email or web address]  Admit - inpatient, med-surg  I reviewed ED provider notes, last 24 h vitals and pain scores, last 48 h intake and output, last 24 h labs and trends, and last 24 h imaging results.  Burnard FORBES Banter, [facility] 08/11/2023, 11:54 AM Please see Amion for pager number during day hours 7:00am-4:30pm or 7:00am -11:30am on weekends

## 2023-08-11 NOTE — ED Provider Notes (Signed)
  EMERGENCY DEPARTMENT AT [facility] Provider Note   CSN: [id number] Arrival date & time: 08/10/23  8144     History  Chief Complaint  Patient presents with   Covid Positive   Abdominal Pain    Covid pos    Alexander [name] is a 53 y.o. male.  Patient presents to the emergency department for evaluation of abdominal pain, cramping, nausea and vomiting.  Patient reports that he tested positive for COVID yesterday.       Home Medications Prior to Admission medications   Medication Sig Start Date End Date Taking? Authorizing Provider  atorvastatin (LIPITOR) 40 MG tablet Take 1 tablet by mouth daily. 04/08/13   [provider]  Coenzyme Q10 (COQ10) 200 MG CAPS Take 1 tablet by mouth daily.    [provider]  REPATHA  SURECLICK 140 MG/ML SOAJ ADMINISTER 1 ML UNDER THE SKIN EVERY 14 DAYS 09/08/22   Hilty, Vinie BROCKS, MD      Allergies    Patient has no known allergies.    Review of Systems   Review of Systems  Physical Exam Updated Vital Signs BP (!) 106/45 (BP Location: Left Arm)   Pulse 84   Temp 98.7 F (37.1 C) (Oral)   Resp 20   Ht 6' 2 (1.88 m)   Wt 86.2 kg   SpO2 99%   BMI 24.39 kg/m  Physical Exam Vitals and nursing note reviewed.  Constitutional:      General: He is not in acute distress.    Appearance: He is well-developed.  HENT:     Head: Normocephalic and atraumatic.     Mouth/Throat:     Mouth: Mucous membranes are moist.  Eyes:     General: Vision grossly intact. Gaze aligned appropriately.     Extraocular Movements: Extraocular movements intact.     Conjunctiva/sclera: Conjunctivae normal.  Cardiovascular:     Rate and Rhythm: Normal rate and regular rhythm.     Pulses: Normal pulses.     Heart sounds: Normal heart sounds, S1 normal and S2 normal. No murmur heard.    No friction rub. No gallop.  Pulmonary:     Effort: Pulmonary effort is normal. No respiratory distress.     Breath sounds: Normal breath  sounds.  Abdominal:     General: Bowel sounds are decreased.     Palpations: Abdomen is soft.     Tenderness: There is generalized abdominal tenderness. There is no guarding or rebound.     Hernia: No hernia is present.  Musculoskeletal:        General: No swelling.     Cervical back: Full passive range of motion without pain, normal range of motion and neck supple. No pain with movement, spinous process tenderness or muscular tenderness. Normal range of motion.     Right lower leg: No edema.     Left lower leg: No edema.  Skin:    General: Skin is warm and dry.     Capillary Refill: Capillary refill takes less than 2 seconds.     Findings: No ecchymosis, erythema, lesion or wound.  Neurological:     Mental Status: He is alert and oriented to person, place, and time.     GCS: GCS eye subscore is 4. GCS verbal subscore is 5. GCS motor subscore is 6.     Cranial Nerves: Cranial nerves 2-12 are intact.     Sensory: Sensation is intact.     Motor: Motor function is  intact. No weakness or abnormal muscle tone.     Coordination: Coordination is intact.  Psychiatric:        Mood and Affect: Mood normal.        Speech: Speech normal.        Behavior: Behavior normal.     ED Results / Procedures / Treatments   Labs (all labs ordered are listed, but only abnormal results are displayed) Labs Reviewed  COMPREHENSIVE METABOLIC PANEL - Abnormal; Notable for the following components:      Result Value   Chloride 96 (*)    Glucose, Bld 115 (*)    Total Bilirubin 1.3 (*)    All other components within normal limits  URINALYSIS, ROUTINE W REFLEX MICROSCOPIC - Abnormal; Notable for the following components:   APPearance HAZY (*)    Hgb urine dipstick SMALL (*)    Bilirubin Urine MODERATE (*)    Ketones, ur >=80 (*)    Protein, ur 100 (*)    All other components within normal limits  URINALYSIS, MICROSCOPIC (REFLEX) - Abnormal; Notable for the following components:   Bacteria, UA FEW (*)     All other components within normal limits  RESP PANEL BY RT-PCR (RSV, FLU A&B, COVID)  RVPGX2  LIPASE, BLOOD  CBC    EKG None  Radiology CT ABDOMEN PELVIS W CONTRAST Addendum Date: 08/11/2023 ADDENDUM REPORT: 08/11/2023 06:33 ADDENDUM: Critical Value/emergent results were called by telephone at the time of interpretation on 08/11/2023 at 6:29 am to provider [facility] , who verbally acknowledged these results. Electronically Signed   By: Francis Quam M.D.   On: 08/11/2023 06:33   Result Date: 08/11/2023 CLINICAL DATA:  Abdominal pain, acute, nonlocalized. EXAM: CT ABDOMEN AND PELVIS WITH CONTRAST TECHNIQUE: Multidetector CT imaging of the abdomen and pelvis was performed using the standard protocol following bolus administration of intravenous contrast. RADIATION DOSE REDUCTION: This exam was performed according to the departmental dose-optimization program which includes automated exposure control, adjustment of the mA and/or kV according to patient size and/or use of iterative reconstruction technique. CONTRAST:  OMNIPAQUE  IOHEXOL  300 MG/ML  SOLN COMPARISON:  CT without contrast 03/11/2018 FINDINGS: Lower chest: Stable 4 mm right lower lobe lateral basal nodule on 302:5, consistent with a benign entity. Lung bases are clear of infiltrates.  The cardiac size is normal. Hepatobiliary: No focal liver abnormality is seen. No calcified gallstones, gallbladder wall thickening, or biliary dilatation. Pancreas: No abnormality. Spleen: No abnormality. Adrenals/Urinary Tract: There is no adrenal mass. There is a 6 mm hypodensity of the outer midpole left kidney which is too small to characterize, consistent with a Bosniak 2 cyst. No follow-up imaging is recommended. The remainder of the bilateral kidneys are homogeneous without mass enhancement. There is no urinary stone or obstruction. The bladder is unremarkable. Stomach/Bowel: Unremarkable gastric wall, duodenum and jejunum. There are dilated  obstructed ileal segments in the mid to lower abdomen up to 4.8 cm. The transition to decompressed caliber appears to be approximately mid ileal judging from the number of collapsed segments downstream. The transitional segment is thickened and inflamed and there is a bowel perforation involving the diseased segment at the level of S1, with the site of perforation believed to be in the midline to the left of midline on 301: 57-58 and on 0601: 80-85. There is a collection of fluid and gas bubbles measuring 3.1 x 2.3 cm on 301:50 adjacent the perforation site which is probably an abscess beginning to form, with a slightly  larger air pocket just anterior to it, and there is scattered free air in the upper to mid abdomen as well including in the porta hepatis and lesser sac. There is sigmoid diverticulosis. The proximal third of the sigmoid directly underlies the inflamed small bowel segment and is also thickened, unknown if this is due to coexisting diverticulitis or reactive in etiology. At present I do not see any interloop fistulization. Rest of the bowel is unremarkable. Vascular/Lymphatic: Aortic atherosclerosis. No enlarged abdominal or pelvic lymph nodes. Reproductive: Prostatomegaly, prostate transverse axis 4.9 cm. Both testicles are in the scrotal sac. Other: In addition to the free air there is minimal pelvic free fluid likely reactive. No incarcerated hernia. Small left inguinal fat hernia Musculoskeletal: Degenerative disc disease and spondylosis L5-S1. 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 No acute or other significant osseous findings. IMPRESSION: 1. Small-bowel obstruction with transition point in approximately the mid ileum, with a perforation involving the thickened transitional segment at the level of S1, with the site of perforation believed to be in the midline to the left of midline. 2. 3.1 x 2.3 cm collection of fluid and gas bubbles adjacent the perforation site which is probably an  abscess beginning to form, with a slightly larger air pocket just anterior to it. 3. Scattered free air in the upper to mid abdomen and minimal pelvic free fluid likely reactive. 4. The diverticulous proximal third of the sigmoid directly underlies the inflamed small bowel segment and is also thickened, unknown if this is due to coexisting diverticulitis or reactive in etiology. 5. Aortic atherosclerosis. 6. Prostatomegaly. 7. Small left inguinal fat hernia. 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 9. PRA is attempting to reach Dr. Haze for stat notification regarding the above. This report will be addended once contact has been made. Aortic Atherosclerosis (ICD10-I70.0). Electronically Signed: By: Francis Quam M.D. On: 08/11/2023 06:24    Procedures Procedures    Medications Ordered in ED Medications  piperacillin -tazobactam (ZOSYN ) IVPB 3.375 g (3.375 g Intravenous New Bag/Given 08/11/23 0642)  0.9 %  sodium chloride  infusion ( Intravenous New Bag/Given 08/11/23 0641)  lactated ringers  bolus 1,000 mL (has no administration in time range)  sodium chloride  0.9 % bolus 1,000 mL ( Intravenous Stopped 08/11/23 0630)  ondansetron  (ZOFRAN ) injection 4 mg (4 mg Intravenous Given 08/11/23 0453)  metoCLOPramide  (REGLAN ) injection 10 mg (10 mg Intravenous Given 08/11/23 0456)  morphine  (PF) 4 MG/ML injection 4 mg (4 mg Intravenous Given 08/11/23 0501)  iohexol  (OMNIPAQUE ) 300 MG/ML solution 100 mL (100 mLs Intravenous Contrast Given 08/11/23 0511)    ED Course/ Medical Decision Making/ A&P                                 Medical Decision Making Amount and/or Complexity of Data Reviewed Labs: ordered. Radiology: ordered.  Risk Prescription drug management.   Differential Diagnosis considered includes, but not limited to: Cholelithiasis; cholecystitis; cholangitis; bowel obstruction; esophagitis; gastritis; peptic ulcer disease; pancreatitis; cardiac.  Presents to the emergency department for  evaluation of abdominal pain.  Patient reports that symptoms have been present for 2 days, has had diffuse, severe abdominal cramping with nausea and vomiting.  Patient reports that he tested positive for COVID yesterday, thought it was just COVID but the pain worsened tonight.  Patient appears dehydrated.  No significant leukocytosis.  Liver function test, lipase normal.  Urinalysis without obvious infection.  Examination revealed diffuse, fairly significant  tenderness.  This did not seem to be consistent with COVID.  Decision was made to perform CT scan to further evaluate.  CT findings discussed with Dr. Beatriz, radiology.  There is evidence of small bowel obstruction at the mid ileum with evidence of perforation and small abscess formation.  There is some additional scattered free air.  There is some thickening of the sigmoid colon but this is felt to be reactive, as the inflamed ileum overlies this region.  Patient remains comfortable, does not look toxic.  Administered IV Zosyn .  Discussed with Dr. Polly, on-call for general surgery.  Patient will need to be transferred for surgical intervention.  CRITICAL CARE Performed by: Lonni JINNY Seats   Total critical care time: 32 minutes  Critical care time was exclusive of separately billable procedures and treating other patients.  Critical care was necessary to treat or prevent imminent or life-threatening deterioration.  Critical care was time spent personally by me on the following activities: development of treatment plan with patient and/or surrogate as well as nursing, discussions with consultants, evaluation of patient's response to treatment, examination of patient, obtaining history from patient or surrogate, ordering and performing treatments and interventions, ordering and review of laboratory studies, ordering and review of radiographic studies, pulse oximetry and re-evaluation of patient's condition.         Final  Clinical Impression(s) / ED Diagnoses Final diagnoses:  SBO (small bowel obstruction) (HCC)  Small bowel perforation (HCC)    Rx / DC Orders ED Discharge Orders     None         Shelonda Saxe, Lonni JINNY, MD 08/11/23 [phone]

## 2023-08-11 NOTE — ED Notes (Signed)
Patient had a positve home covid test, however negative with respiratory panel here in ED

## 2023-08-11 NOTE — Op Note (Signed)
 Operative Note  Pre-operative Diagnosis:  small bowel perforation  Post-operative Diagnosis:  abdominal abscess  Surgeon:  Krystal Spinner, MD  Assistant:  Burnard Banter, PA-C   Procedure:  Exploratory laparotomy with drainage of abdominal abscess  Anesthesia:  general  Estimated Blood Loss:  25 cc  Drains: 19 Fr Blake drain to lower abdomen and pelvis         Specimen: none  Indications: Patient is a 53 year old male who presented to the emergency department with 4-day history of mid abdominal pain.  This was associated with shoulder pain, sweats, and chills.  Laboratory studies showed a normal white blood cell count of 10.  CT scan of the abdomen and pelvis showed a fluid collection in the lower abdomen between loops of small bowel with air bubbles consistent with small bowel perforation.  Patient was transferred to [facility] for urgent surgical intervention.  Procedure:  The patient was seen in the pre-op holding area. The risks, benefits, complications, treatment options, and expected outcomes were previously discussed with the patient. The patient agreed with the proposed plan and has signed the informed consent form.  The patient was brought to the operating room by the surgical team, identified as Alexander [name] and the procedure verified. A time out was completed and the above information confirmed.  Following administration of general anesthesia, the patient was positioned and then prepped and draped in usual aseptic fashion.  After ascertaining that an adequate level of anesthesia been achieved, a midline abdominal incision is made with a #10 blade.  Dissection is carried through subcutaneous tissues and hemostasis achieved with the electrocautery.  Fascia is incised in the midline and the peritoneal cavity is entered cautiously.  There is a small amount of clear ascitic fluid present.  There are numerous dilated loops of small bowel which appear viable.  Incision is  extended slightly cephalad and caudad.  Exploration reveals an inflammatory mass in the lower abdomen and upper pelvis.  Small bowel is run retrograde from the ileocecal valve where the bowel appears to be decompressed back to the inflammatory process.  There is moderately to markedly dilated bowel proximal to the inflammatory process back to nearly the ligament of Treitz where the bowel is decompressed.  Using gentle blunt dissection the involved loops of small bowel are mobilized out of the lower abdomen and pelvis.  There is a small amount of brownish fluid present which is evacuated and is without odor.  There are significant inflammatory changes involving a single loop of small bowel, mostly involving the mesentery.  There is no sign of perforation.  There is no sign of ischemia.  There is no sign of obstruction.  Examination of the sigmoid colon shows that the mid to proximal sigmoid colon antimesenteric wall formed a portion of the abscess cavity.  It is indurated.  Again there is no sign of perforation.  There is no sign of obstruction.  There is no odor.  There is no drainage.  It is difficult to tell during exploration whether or not this is a primary sigmoid colon issue such as focal diverticulitis with perforated diverticulum or whether this represents a primary problem with a single loop of the small intestine.  There are minimal changes of diverticular disease along the sigmoid colon.  The remainder of the small bowel appears grossly normal with no signs of inflammatory bowel disease or other pathology.  I asked Dr. Herlene Bureau to come to the operating room and explained the situation  to him.  He visually examined the involved areas.  After discussion, we decided not to resect the sigmoid colon and not to resect the involved loop of small bowel.  We did decide to place a drain into the involved area where the abscess had been encountered.  Abdomen is then irrigated copiously with warm  saline which is evacuated.  Viscera are returned to their normal anatomic locations.  Omentum is used to cover the small bowel.  A nasogastric tube is properly positioned within the stomach.  A 19 French Blake drain is brought in from the left lower quadrant stab wound and placed through the area where the abscess had been present along the medial aspect of the sigmoid colon and into the pelvis.  It is secured to the skin with a 2-0 nylon suture.  Midline fascia is closed with running #1 Novafil suture.  Subcutaneous tissues are irrigated with warm saline.  Skin is closed with stainless steel staples.  Wound was washed and dried and a honeycomb dressing is applied.  Drain is placed to bulb suction.  Patient is awakened from anesthesia and transferred to the recovery room in stable condition.  The patient tolerated the procedure well.   Krystal Spinner, MD [facility] Surgery Office: [phone]

## 2023-08-11 NOTE — Progress Notes (Signed)
Alexander [name], Had Exp lap for Small Bowel Perf. Pt. is asking for K-Pad for chronic Back pain.

## 2023-08-11 NOTE — Anesthesia Procedure Notes (Signed)
 Procedure Name: Intubation Date/Time: 08/11/2023 12:26 PM  Performed by: Deeann Eva BROCKS, CRNAPre-anesthesia Checklist: Patient identified, Emergency Drugs available, Suction available, Patient being monitored and Timeout performed Patient Re-evaluated:Patient Re-evaluated prior to induction Oxygen Delivery Method: Circle system utilized Preoxygenation: Pre-oxygenation with 100% oxygen Induction Type: IV induction, Rapid sequence and Cricoid Pressure applied Laryngoscope Size: Mac and 3 Grade View: Grade I Tube type: Oral Tube size: 8.0 mm Number of attempts: 1 Airway Equipment and Method: Stylet Placement Confirmation: ETT inserted through vocal cords under direct vision, positive ETCO2 and breath sounds checked- equal and bilateral Secured at: 24 cm Tube secured with: Tape Dental Injury: Teeth and Oropharynx as per pre-operative assessment

## 2023-08-11 NOTE — ED Notes (Signed)
Report given to short stay.  

## 2023-08-11 NOTE — Transfer of Care (Signed)
 Immediate Anesthesia Transfer of Care Note  Patient: Alexander [name]  Procedure(s) Performed: EXPLORATORY LAPAROTOMY, DRAINAGE OF PELVIC ABSCESS  Patient Location: PACU  Anesthesia Type:General  Level of Consciousness: drowsy  Airway & Oxygen Therapy: Patient Spontanous Breathing and Patient connected to face mask oxygen  Post-op Assessment: Report given to RN and Post -op Vital signs reviewed and stable  Post vital signs: Reviewed and stable  Last Vitals:  Vitals Value Taken Time  BP 149/95 08/11/23 1408  Temp    Pulse 94 08/11/23 1410  Resp 12 08/11/23 1410  SpO2 100 % 08/11/23 1410  Vitals shown include unfiled device data.  Last Pain:  Vitals:   08/11/23 1054  TempSrc: Oral  PainSc: 5          Complications: No notable events documented.

## 2023-08-12 ENCOUNTER — Other Ambulatory Visit: Payer: Self-pay

## 2023-08-12 ENCOUNTER — Encounter (HOSPITAL_COMMUNITY): Payer: Self-pay | Admitting: Surgery

## 2023-08-12 DIAGNOSIS — K651 Peritoneal abscess: Secondary | ICD-10-CM | POA: Diagnosis present

## 2023-08-12 LAB — CBC
HCT: 38.1 % — ABNORMAL LOW (ref 39.0–52.0)
Hemoglobin: 12.8 g/dL — ABNORMAL LOW (ref 13.0–17.0)
MCH: 31.6 pg (ref 26.0–34.0)
MCHC: 33.6 g/dL (ref 30.0–36.0)
MCV: 94.1 fL (ref 80.0–100.0)
Platelets: 230 10*3/uL (ref 150–400)
RBC: 4.05 MIL/uL — ABNORMAL LOW (ref 4.22–5.81)
RDW: 11.8 % (ref 11.5–15.5)
WBC: 11.1 10*3/uL — ABNORMAL HIGH (ref 4.0–10.5)
nRBC: 0 % (ref 0.0–0.2)

## 2023-08-12 LAB — BASIC METABOLIC PANEL
Anion gap: 12 (ref 5–15)
BUN: 12 mg/dL (ref 6–20)
CO2: 25 mmol/L (ref 22–32)
Calcium: 8.3 mg/dL — ABNORMAL LOW (ref 8.9–10.3)
Chloride: 98 mmol/L (ref 98–111)
Creatinine, Ser: 0.74 mg/dL (ref 0.61–1.24)
GFR, Estimated: >60 mL/min (ref >60)
Glucose, Bld: 139 mg/dL — ABNORMAL HIGH (ref 70–99)
Potassium: 3.9 mmol/L (ref 3.5–5.1)
Sodium: 135 mmol/L (ref 135–145)

## 2023-08-12 MED ORDER — PHENOL 1.4 % MT LIQD
1.0000 | OROMUCOSAL | Status: DC | PRN
  Filled 2023-08-12: qty 177

## 2023-08-12 MED ORDER — KCL IN DEXTROSE-NACL 20-5-0.45 MEQ/L-%-% IV SOLN
INTRAVENOUS | Status: DC
  Filled 2023-08-12 (×3): qty 1000

## 2023-08-12 MED ORDER — ACETAMINOPHEN 10 MG/ML IV SOLN
1000.0000 mg | Freq: Four times a day (QID) | INTRAVENOUS | Status: AC
Start: 2023-08-12 — End: 2023-08-13
  Administered 2023-08-12 – 2023-08-13 (×3): 1000 mg via INTRAVENOUS
  Filled 2023-08-12 (×3): qty 100

## 2023-08-12 MED ORDER — MENTHOL 3 MG MT LOZG
1.0000 | LOZENGE | OROMUCOSAL | Status: DC | PRN
  Administered 2023-08-12: 3 mg via ORAL
  Filled 2023-08-12: qty 9

## 2023-08-12 NOTE — Progress Notes (Signed)
 Progress Note  1 Day Post-Op  Subjective: Abdominal pain overall controlled with pain medications. He has ambulated since OR. Some burping but no flatus or BM. Gagging a lot with NGT despite efforts with throat spray and position changes.  High BP noted he states he monitors at home with systolic normally in 130s and diastolic in 80s. He reports BP goes up in the healthcare setting and with pain. He is not on BP medication at baseline  Significant other at bedside  Objective: Vital signs in last 24 hours: Temp:  [97.8 F (36.6 C)-98.9 F (37.2 C)] 97.9 F (36.6 C) (02/05 0620) Pulse Rate:  [89-111] 95 (02/05 0620) Resp:  [12-18] 18 (02/05 0620) BP: (140-173)/(85-103) 156/96 (02/05 0620) SpO2:  [92 %-100 %] 97 % (02/05 0620) Weight:  [86.2 kg] 86.2 kg (02/04 1054) Last BM Date : 08/10/23  Intake/Output from previous day: 02/04 0701 - 02/05 0700 In: 2236.9 [P.O.:150; I.V.:1593.5; IV Piggyback:493.3] Out: 1870 [Urine:1300; Emesis/NG output:300; Drains:245; Blood:25] Intake/Output this shift: No intake/output data recorded.  PE: General: pleasant, WD, male who is laying in bed in NAD Lungs: Respiratory effort nonlabored Abd: soft, mild distension, mild expected TTP over incision which is cdi with staples. Drain SS. NGT with dark bile tinged output MSK: all 4 extremities are symmetrical with no cyanosis, clubbing, or edema. Skin: warm and dry Psych: A&Ox3 with an appropriate affect.    Lab Results:  Recent Labs    08/10/23 1925 08/12/23 0502  WBC 10.5 11.1*  HGB 14.6 12.8*  HCT 43.0 38.1*  PLT 270 230   BMET Recent Labs    08/10/23 1925 08/12/23 0502  NA 136 135  K 3.8 3.9  CL 96* 98  CO2 26 25  GLUCOSE 115* 139*  BUN 16 12  CREATININE 0.93 0.74  CALCIUM 9.1 8.3*   PT/INR No results for input(s): LABPROT, INR in the last 72 hours. CMP     Component Value Date/Time   NA 135 08/12/2023 0502   K 3.9 08/12/2023 0502   CL 98 08/12/2023 0502   CO2  25 08/12/2023 0502   GLUCOSE 139 (H) 08/12/2023 0502   BUN 12 08/12/2023 0502   CREATININE 0.74 08/12/2023 0502   CALCIUM 8.3 (L) 08/12/2023 0502   PROT 7.7 08/10/2023 1925   ALBUMIN 3.8 08/10/2023 1925   AST 31 08/10/2023 1925   ALT 37 08/10/2023 1925   ALKPHOS 73 08/10/2023 1925   BILITOT 1.3 (H) 08/10/2023 1925   GFRNONAA >60 08/12/2023 0502   GFRAA >60 03/11/2018 0810   Lipase     Component Value Date/Time   LIPASE 22 08/10/2023 1925       Studies/Results: CT ABDOMEN PELVIS W CONTRAST Addendum Date: 08/11/2023 ADDENDUM REPORT: 08/11/2023 06:33 ADDENDUM: Critical Value/emergent results were called by telephone at the time of interpretation on 08/11/2023 at 6:29 am to provider [facility] , who verbally acknowledged these results. Electronically Signed   By: Francis Quam M.D.   On: 08/11/2023 06:33   Result Date: 08/11/2023 CLINICAL DATA:  Abdominal pain, acute, nonlocalized. EXAM: CT ABDOMEN AND PELVIS WITH CONTRAST TECHNIQUE: Multidetector CT imaging of the abdomen and pelvis was performed using the standard protocol following bolus administration of intravenous contrast. RADIATION DOSE REDUCTION: This exam was performed according to the departmental dose-optimization program which includes automated exposure control, adjustment of the mA and/or kV according to patient size and/or use of iterative reconstruction technique. CONTRAST:  OMNIPAQUE  IOHEXOL  300 MG/ML  SOLN COMPARISON:  CT without  contrast 03/11/2018 FINDINGS: Lower chest: Stable 4 mm right lower lobe lateral basal nodule on 302:5, consistent with a benign entity. Lung bases are clear of infiltrates.  The cardiac size is normal. Hepatobiliary: No focal liver abnormality is seen. No calcified gallstones, gallbladder wall thickening, or biliary dilatation. Pancreas: No abnormality. Spleen: No abnormality. Adrenals/Urinary Tract: There is no adrenal mass. There is a 6 mm hypodensity of the outer midpole left kidney  which is too small to characterize, consistent with a Bosniak 2 cyst. No follow-up imaging is recommended. The remainder of the bilateral kidneys are homogeneous without mass enhancement. There is no urinary stone or obstruction. The bladder is unremarkable. Stomach/Bowel: Unremarkable gastric wall, duodenum and jejunum. There are dilated obstructed ileal segments in the mid to lower abdomen up to 4.8 cm. The transition to decompressed caliber appears to be approximately mid ileal judging from the number of collapsed segments downstream. The transitional segment is thickened and inflamed and there is a bowel perforation involving the diseased segment at the level of S1, with the site of perforation believed to be in the midline to the left of midline on 301: 57-58 and on 0601: 80-85. There is a collection of fluid and gas bubbles measuring 3.1 x 2.3 cm on 301:50 adjacent the perforation site which is probably an abscess beginning to form, with a slightly larger air pocket just anterior to it, and there is scattered free air in the upper to mid abdomen as well including in the porta hepatis and lesser sac. There is sigmoid diverticulosis. The proximal third of the sigmoid directly underlies the inflamed small bowel segment and is also thickened, unknown if this is due to coexisting diverticulitis or reactive in etiology. At present I do not see any interloop fistulization. Rest of the bowel is unremarkable. Vascular/Lymphatic: Aortic atherosclerosis. No enlarged abdominal or pelvic lymph nodes. Reproductive: Prostatomegaly, prostate transverse axis 4.9 cm. Both testicles are in the scrotal sac. Other: In addition to the free air there is minimal pelvic free fluid likely reactive. No incarcerated hernia. Small left inguinal fat hernia Musculoskeletal: Degenerative disc disease and spondylosis L5-S1. 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 No acute or other significant osseous findings. IMPRESSION: 1.  Small-bowel obstruction with transition point in approximately the mid ileum, with a perforation involving the thickened transitional segment at the level of S1, with the site of perforation believed to be in the midline to the left of midline. 2. 3.1 x 2.3 cm collection of fluid and gas bubbles adjacent the perforation site which is probably an abscess beginning to form, with a slightly larger air pocket just anterior to it. 3. Scattered free air in the upper to mid abdomen and minimal pelvic free fluid likely reactive. 4. The diverticulous proximal third of the sigmoid directly underlies the inflamed small bowel segment and is also thickened, unknown if this is due to coexisting diverticulitis or reactive in etiology. 5. Aortic atherosclerosis. 6. Prostatomegaly. 7. Small left inguinal fat hernia. 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 9. PRA is attempting to reach Dr. Haze for stat notification regarding the above. This report will be addended once contact has been made. Aortic Atherosclerosis (ICD10-I70.0). Electronically Signed: By: Francis Quam M.D. On: 08/11/2023 06:24    Anti-infectives: Anti-infectives (From admission, onward)    Start     Dose/Rate Route Frequency Ordered Stop   08/11/23 2200  piperacillin -tazobactam (ZOSYN ) IVPB 3.375 g        3.375 g 12.5 mL/hr over 240  Minutes Intravenous Every 8 hours 08/11/23 1600     08/11/23 1400  piperacillin -tazobactam (ZOSYN ) IVPB 3.375 g  Status:  Discontinued        3.375 g 12.5 mL/hr over 240 Minutes Intravenous Every 8 hours 08/11/23 0747 08/11/23 1554   08/11/23 0800  piperacillin -tazobactam (ZOSYN ) IVPB 3.375 g  Status:  Discontinued        3.375 g 12.5 mL/hr over 240 Minutes Intravenous Every 8 hours 08/11/23 0740 08/11/23 0747   08/11/23 0630  piperacillin -tazobactam (ZOSYN ) IVPB 3.375 g        3.375 g 100 mL/hr over 30 Minutes Intravenous  Once 08/11/23 0629 08/11/23 0736        Assessment/Plan  Intra abdominal  abscess POD1 s/p ex lap with drainage of IAA Dr. Eletha 2/4  - NGT with 343ml/24h. Continue LIWS and AROBF - WBC mildly elevated post op 11. Continue zosyn  and monitor wbc and fever curve - JP SS - continue for now  - encouraged ambulation and IS use  FEN: NPO NGT LIWS, IVF @ 165ml/hr ID: zosyn  2/4> VTE: lovenox   HTN - cont to monitor while NPO. Recommend f/u with PCP   LOS: 1 day   [place] VEAR Mais, [facility] Surgery 08/12/2023, 10:46 AM Please see Amion for pager number during day hours 7:00am-4:30pm

## 2023-08-13 MED ORDER — KCL IN DEXTROSE-NACL 20-5-0.45 MEQ/L-%-% IV SOLN
INTRAVENOUS | Status: DC
  Filled 2023-08-13 (×2): qty 1000

## 2023-08-13 MED ORDER — SIMETHICONE 80 MG PO CHEW
80.0000 mg | CHEWABLE_TABLET | Freq: Four times a day (QID) | ORAL | Status: DC
  Administered 2023-08-13 – 2023-08-15 (×10): 80 mg via ORAL
  Filled 2023-08-13 (×11): qty 1

## 2023-08-13 NOTE — Progress Notes (Signed)
 Progress Note  2 Days Post-Op  Subjective: He has been up in chair and ambulating a lot even overnight. He has started to pass some flatus. Still bloated but less belching. Abdominal pain controlled.  Significant other at bedside  Objective: Vital signs in last 24 hours: Temp:  [97.6 F (36.4 C)-98.1 F (36.7 C)] 98.1 F (36.7 C) (02/05 2112) Pulse Rate:  [80-92] 80 (02/06 0540) Resp:  [18] 18 (02/06 0540) BP: (141-156)/(85-94) 141/85 (02/06 0540) SpO2:  [96 %-99 %] 96 % (02/06 0540) Last BM Date : 08/10/23  Intake/Output from previous day: 02/05 0701 - 02/06 0700 In: 1707 [P.O.:30; I.V.:1327.1; IV Piggyback:349.9] Out: 1280 [Urine:900; Emesis/NG output:300; Drains:80] Intake/Output this shift: No intake/output data recorded.  PE: General: pleasant, WD, male who is laying in bed in NAD Lungs: Respiratory effort nonlabored Abd: soft, mild distension - stable from yesterday, mild expected TTP over incision which is cdi with staples. Drain SS. NGT with dark bile output MSK: all 4 extremities are symmetrical with no cyanosis, clubbing, or edema. Skin: warm and dry Psych: A&Ox3 with an appropriate affect.    Lab Results:  Recent Labs    08/10/23 1925 08/12/23 0502  WBC 10.5 11.1*  HGB 14.6 12.8*  HCT 43.0 38.1*  PLT 270 230   BMET Recent Labs    08/10/23 1925 08/12/23 0502  NA 136 135  K 3.8 3.9  CL 96* 98  CO2 26 25  GLUCOSE 115* 139*  BUN 16 12  CREATININE 0.93 0.74  CALCIUM 9.1 8.3*   PT/INR No results for input(s): LABPROT, INR in the last 72 hours. CMP     Component Value Date/Time   NA 135 08/12/2023 0502   K 3.9 08/12/2023 0502   CL 98 08/12/2023 0502   CO2 25 08/12/2023 0502   GLUCOSE 139 (H) 08/12/2023 0502   BUN 12 08/12/2023 0502   CREATININE 0.74 08/12/2023 0502   CALCIUM 8.3 (L) 08/12/2023 0502   PROT 7.7 08/10/2023 1925   ALBUMIN 3.8 08/10/2023 1925   AST 31 08/10/2023 1925   ALT 37 08/10/2023 1925   ALKPHOS 73 08/10/2023  1925   BILITOT 1.3 (H) 08/10/2023 1925   GFRNONAA >60 08/12/2023 0502   GFRAA >60 03/11/2018 0810   Lipase     Component Value Date/Time   LIPASE 22 08/10/2023 1925       Studies/Results: No results found.   Anti-infectives: Anti-infectives (From admission, onward)    Start     Dose/Rate Route Frequency Ordered Stop   08/11/23 2200  piperacillin -tazobactam (ZOSYN ) IVPB 3.375 g        3.375 g 12.5 mL/hr over 240 Minutes Intravenous Every 8 hours 08/11/23 1600     08/11/23 1400  piperacillin -tazobactam (ZOSYN ) IVPB 3.375 g  Status:  Discontinued        3.375 g 12.5 mL/hr over 240 Minutes Intravenous Every 8 hours 08/11/23 0747 08/11/23 1554   08/11/23 0800  piperacillin -tazobactam (ZOSYN ) IVPB 3.375 g  Status:  Discontinued        3.375 g 12.5 mL/hr over 240 Minutes Intravenous Every 8 hours 08/11/23 0740 08/11/23 0747   08/11/23 0630  piperacillin -tazobactam (ZOSYN ) IVPB 3.375 g        3.375 g 100 mL/hr over 30 Minutes Intravenous  Once 08/11/23 0629 08/11/23 0736        Assessment/Plan  Intra abdominal abscess POD2 s/p ex lap with drainage of IAA Dr. Eletha 2/4  - NGT with 330ml/24h, passing flatus. Dc NGT this  am and can have sips of clears. - afebrile, WBC pending. Continue zosyn  and monitor wbc and fever curve - JP SS - continue for now  - encouraged ambulation and IS use  FEN: sips of clears, IVF @ 178ml/hr ID: zosyn  2/4> VTE: lovenox   HTN - cont to monitor. Recommend f/u with PCP   LOS: 2 days   [place] VEAR Mais, [facility] Surgery 08/13/2023, 10:14 AM Please see Amion for pager number during day hours 7:00am-4:30pm

## 2023-08-13 NOTE — Plan of Care (Signed)
   Problem: Education: Goal: Knowledge of General Education information will improve Description: Including pain rating scale, medication(s)/side effects and non-pharmacologic comfort measures Outcome: Progressing   Problem: Activity: Goal: Risk for activity intolerance will decrease Outcome: Progressing

## 2023-08-13 NOTE — Plan of Care (Signed)
 ?  Problem: Elimination: ?Goal: Will not experience complications related to urinary retention ?Outcome: Progressing ?  ?

## 2023-08-14 LAB — CBC
HCT: 36.3 % — ABNORMAL LOW (ref 39.0–52.0)
Hemoglobin: 12.1 g/dL — ABNORMAL LOW (ref 13.0–17.0)
MCH: 31.3 pg (ref 26.0–34.0)
MCHC: 33.3 g/dL (ref 30.0–36.0)
MCV: 93.8 fL (ref 80.0–100.0)
Platelets: 245 10*3/uL (ref 150–400)
RBC: 3.87 MIL/uL — ABNORMAL LOW (ref 4.22–5.81)
RDW: 11.9 % (ref 11.5–15.5)
WBC: 7.2 10*3/uL (ref 4.0–10.5)
nRBC: 0 % (ref 0.0–0.2)

## 2023-08-14 MED ORDER — DOCUSATE SODIUM 100 MG PO CAPS
100.0000 mg | ORAL_CAPSULE | Freq: Two times a day (BID) | ORAL | Status: DC
  Administered 2023-08-14: 100 mg via ORAL
  Filled 2023-08-14 (×2): qty 1

## 2023-08-14 MED ORDER — OXYCODONE HCL 5 MG PO TABS: 5.0000 mg | ORAL_TABLET | ORAL | Status: DC | PRN

## 2023-08-14 MED ORDER — METHOCARBAMOL 500 MG PO TABS
500.0000 mg | ORAL_TABLET | Freq: Three times a day (TID) | ORAL | Status: DC
  Administered 2023-08-14 – 2023-08-16 (×6): 500 mg via ORAL
  Filled 2023-08-14 (×6): qty 1

## 2023-08-14 MED ORDER — POLYETHYLENE GLYCOL 3350 17 G PO PACK: 17.0000 g | PACK | Freq: Every day | ORAL | Status: DC | PRN

## 2023-08-14 NOTE — Progress Notes (Signed)
 Progress Note  3 Days Post-Op  Subjective: He has tolerated sips of clears and now had 2 bowel movements and pain improved after. Passing flatus though still bloated.   Significant other at bedside  Objective: Vital signs in last 24 hours: Temp:  [98.4 F (36.9 C)-99.7 F (37.6 C)] 98.4 F (36.9 C) (02/07 0532) Pulse Rate:  [73-79] 73 (02/07 0532) Resp:  [16-18] 16 (02/07 0532) BP: (111-144)/(73-86) 135/80 (02/07 0532) SpO2:  [97 %-98 %] 97 % (02/07 0532) Last BM Date : 08/13/23  Intake/Output from previous day: 02/06 0701 - 02/07 0700 In: 2317 [P.O.:60; I.V.:1999.2; IV Piggyback:257.9] Out: 855 [Urine:650; Drains:205] Intake/Output this shift: No intake/output data recorded.  PE: General: pleasant, WD, male who is sitting up in chair in NAD Lungs: Respiratory effort nonlabored Abd: soft, mild distension - stable from yesterday, mild expected TTP over incision which is cdi with staples. Drain SS MSK: all 4 extremities are symmetrical with no cyanosis, clubbing, or edema. Skin: warm and dry Psych: A&Ox3 with an appropriate affect.    Lab Results:  Recent Labs    08/12/23 0502 08/14/23 0454  WBC 11.1* 7.2  HGB 12.8* 12.1*  HCT 38.1* 36.3*  PLT 230 245   BMET Recent Labs    08/12/23 0502  NA 135  K 3.9  CL 98  CO2 25  GLUCOSE 139*  BUN 12  CREATININE 0.74  CALCIUM 8.3*   PT/INR No results for input(s): LABPROT, INR in the last 72 hours. CMP     Component Value Date/Time   NA 135 08/12/2023 0502   K 3.9 08/12/2023 0502   CL 98 08/12/2023 0502   CO2 25 08/12/2023 0502   GLUCOSE 139 (H) 08/12/2023 0502   BUN 12 08/12/2023 0502   CREATININE 0.74 08/12/2023 0502   CALCIUM 8.3 (L) 08/12/2023 0502   PROT 7.7 08/10/2023 1925   ALBUMIN 3.8 08/10/2023 1925   AST 31 08/10/2023 1925   ALT 37 08/10/2023 1925   ALKPHOS 73 08/10/2023 1925   BILITOT 1.3 (H) 08/10/2023 1925   GFRNONAA >60 08/12/2023 0502   GFRAA >60 03/11/2018 0810   Lipase      Component Value Date/Time   LIPASE 22 08/10/2023 1925       Studies/Results: No results found.   Anti-infectives: Anti-infectives (From admission, onward)    Start     Dose/Rate Route Frequency Ordered Stop   08/11/23 2200  piperacillin -tazobactam (ZOSYN ) IVPB 3.375 g        3.375 g 12.5 mL/hr over 240 Minutes Intravenous Every 8 hours 08/11/23 1600     08/11/23 1400  piperacillin -tazobactam (ZOSYN ) IVPB 3.375 g  Status:  Discontinued        3.375 g 12.5 mL/hr over 240 Minutes Intravenous Every 8 hours 08/11/23 0747 08/11/23 1554   08/11/23 0800  piperacillin -tazobactam (ZOSYN ) IVPB 3.375 g  Status:  Discontinued        3.375 g 12.5 mL/hr over 240 Minutes Intravenous Every 8 hours 08/11/23 0740 08/11/23 0747   08/11/23 0630  piperacillin -tazobactam (ZOSYN ) IVPB 3.375 g        3.375 g 100 mL/hr over 30 Minutes Intravenous  Once 08/11/23 0629 08/11/23 0736        Assessment/Plan  Intra abdominal abscess POD3 s/p ex lap with drainage of IAA Dr. Eletha 2/4  - tolerating sips of clears. Bm x2. Start fld and adat soft - afebrile, WBC nml. Continue zosyn  and monitor wbc and fever curve. Continue PO abx x1 wk at  dc - JP SS - continue for now 205 ml/24h. Possibly out prior to dc - encouraged ambulation and IS use  Hopefully home in next 24 hours  FEN: FLD ADAT soft, SLIV ID: zosyn  2/4> VTE: lovenox   HTN - cont to monitor. Recommend f/u with PCP   LOS: 3 days   [place] VEAR Mais, [facility] Surgery 08/14/2023, 8:58 AM Please see Amion for pager number during day hours 7:00am-4:30pm

## 2023-08-14 NOTE — TOC Initial Note (Signed)
 Transition of Care [facility]) - Initial/Assessment Note    Patient Details  Name: Alexander [name] MRN: [id number] Date of Birth: [birth year]  Transition of Care [facility]) CM/SW Contact:    Sonda Manuella Quill, RN Phone Number: 08/14/2023, 12:03 PM  Clinical Narrative:                 [place] w/ pt and wife Roxie in room; pt says he lives at home; he plans to return at d/c; pt verified PCP/insurance; he denies SDOH risks; pt says he does not have DME, HH services, or home oxygen; no TOC needs; please place consult if needed.  Expected Discharge Plan: Home/Self Care Barriers to Discharge: Continued Medical Work up   Patient Goals and CMS Choice Patient states their goals for this hospitalization and ongoing recovery are:: home          Expected Discharge Plan and Services   Discharge Planning Services: CM Consult   Living arrangements for the past 2 months: Single Family Home                                      Prior Living Arrangements/Services Living arrangements for the past 2 months: Single Family Home Lives with:: Spouse Patient language and need for interpreter reviewed:: Yes Do you feel safe going back to the place where you live?: Yes      Need for Family Participation in Patient Care: Yes (Comment) Care giver support system in place?: Yes (comment) Current home services:  (n/a) Criminal Activity/Legal Involvement Pertinent to Current Situation/Hospitalization: No - Comment as needed  Activities of Daily Living   ADL Screening (condition at time of admission) Independently performs ADLs?: Yes (appropriate for developmental age) Is the patient deaf or have difficulty hearing?: No Does the patient have difficulty seeing, even when wearing glasses/contacts?: No Does the patient have difficulty concentrating, remembering, or making decisions?: No  Permission Sought/Granted Permission sought to share information with : Case Manager Permission granted to share  information with : Yes, Verbal Permission Granted  Share Information with NAME: Case Manager     Permission granted to share info w Relationship: Ilir Mahrt (spouse) [phone]     Emotional Assessment Appearance:: Appears stated age Attitude/Demeanor/Rapport: Gracious Affect (typically observed): Accepting Orientation: : Oriented to Self, Oriented to Place, Oriented to  Time, Oriented to Situation Alcohol  / Substance Use: Not Applicable Psych Involvement: No (comment)  Admission diagnosis:  SBO (small bowel obstruction) (HCC) [K56.609] Small bowel perforation (HCC) [K63.1] Perforated sigmoid colon (HCC) [K63.1] Patient Active Problem List   Diagnosis Date Noted   Intra-abdominal abscess (HCC) 08/12/2023   Anxiety 05/13/2013   GERD (gastroesophageal reflux disease) 04/11/2013   Dyslipidemia 12/24/2012   Family history of premature CAD 12/24/2012   Atypical chest pain 12/24/2012   PCP:  Shayne Anes, MD Pharmacy:   [id number] - [place], [place] - [address] [place] [address] Phone: [phone] Fax: [phone]     Social Drivers of Health (SDOH) Social History: SDOH Screenings   Food Insecurity: No Food Insecurity (08/14/2023)  Housing: Low Risk  (08/14/2023)  Transportation Needs: No Transportation Needs (08/14/2023)  Utilities: Not At Risk (08/14/2023)  Social Connections: Socially Integrated (08/11/2023)  Tobacco Use: Medium Risk (08/11/2023)   SDOH Interventions: Food Insecurity Interventions: Intervention Not Indicated, Inpatient TOC Housing Interventions: Intervention Not  Indicated, Inpatient TOC Transportation Interventions: Intervention Not Indicated, Inpatient TOC Utilities Interventions: Intervention Not Indicated, Inpatient TOC   Readmission Risk Interventions     No data to display

## 2023-08-14 NOTE — Plan of Care (Signed)
   Problem: Clinical Measurements: Goal: Will remain free from infection Outcome: Progressing Goal: Diagnostic test results will improve Outcome: Progressing

## 2023-08-14 NOTE — Discharge Instructions (Signed)
MIDLINE WOUND CARE: - midline dressing to be changed daily - supplies: normal saline, kerlix/gauze, scissors, ABD pads, tape  - remove dressing and all packing carefully, moistening with sterile saline as needed to avoid packing/internal dressing sticking to the wound. - clean edges of skin around the wound with water/gauze, making sure there is no tape debris or leakage left on skin that could cause skin irritation or breakdown. - dampen a clean kerlix/gauze with saline and pack wound from wound base to skin level,  Wound can be packed loosely. Trim kerlix to size if a whole kerlix is not required. - cover wound with a dry ABD pad and secure with tape.  - apply any skin protectant/powder recommended by clinician to protect skin/skin folds. - change dressing as needed if leakage occurs, wound gets contaminated, or patient requests to shower. - patient may shower daily with wound open and following the shower the wound should be dried and a clean dressing placed.     CIRUGA: INSTRUCCIONES POSTOP (Ciruga de obstruccin del intestino delgado, reseccin de colon, etc.)   ############################################## ###################  COME Transicin gradual a una dieta alta en fibra con un suplemento de fibra durante los prximos das despus del alta  CAMINAR Camina una hora al da. Controla tu dolor para hacer eso.  CONTROLAR EL DOLOR Controle el dolor para que pueda caminar, dormir, tolerar estornudos/tos, subir/bajar escaleras.  TENGA UNA DEPOSICIN DIARIAMENTE Mantenga sus evacuaciones regulares para evitar problemas. Est bien probar un laxante para anular el estreimiento. Est bien usar un antidiarreico para retrasar la diarrea. Llame si no mejora despus de 2 intentos  LLAME SI TIENE PROBLEMAS/INQUIETUDES Llame si todava tiene problemas a pesar de seguir estas instrucciones. Llame si tiene inquietudes que no han sido respondidas por estas  instrucciones  ############################################## ###################   DIETA Seguir una dieta ligera los primeros das en casa. Comience con una dieta blanda como sopas, lquidos, alimentos ricos en almidn, alimentos bajos en grasa, etc. Si se siente lleno, hinchado o estreido, siga una dieta lquida completa o en pur/licuada durante unos das hasta que se sienta mejor y no ms tiempo estreido. Asegrese de beber muchos lquidos todos los das para evitar deshidratarse (sentirse mareado, no orinar, etc.). Agregue gradualmente un suplemento de fibra a su dieta durante la prxima semana. Vuelva gradualmente a una dieta slida regular. Evite la comida rpida o las comidas pesadas la primera semana, ya que es ms probable que tenga nuseas. Se espera que su tracto digestivo necesite algunos meses para volver a la normalidad. Es comn que sus evacuaciones intestinales y heces sean irregulares. Tendr hinchazn y calambres ocasionales que eventualmente desaparecern. Hasta que est comiendo alimentos slidos normalmente, deje de tomar todos los medicamentos para el dolor y regrese a sus actividades regulares; sus intestinos no sern normales. Concntrese en llevar una dieta baja en grasas y alta en fibra por el resto de su vida (consulte Cmo lograr una buena salud intestinal, a continuacin).  CUIDADO de su INCISIN o HERIDA Es bueno para incisiones cerradas e incluso heridas abiertas para lavarse todos los das. Dchate todos los das. Los baos cortos estn bien. Lave las incisiones y heridas con agua y jabn.  Si tiene una(s) incisin(es) cerrada(s), lvela con agua y jabn todos los das. Puede dejar las incisiones cerradas abiertas al aire si est seco. Puede cubrir la incisin con una gasa limpia y volver a colocarla despus de su ducha diaria para mayor comodidad.  Es bueno lavar las incisiones cerradas e incluso las   heridas abiertas todos los das. Dchate todos los das. Los  baos cortos estn bien. Lave las incisiones y heridas con agua y jabn. Puede dejar las incisiones cerradas abiertas al aire si est seco. Puede cubrir la incisin con una gasa limpia y volver a colocarla despus de su ducha diaria para mayor comodidad.  Si tiene una herida abierta con una aspiradora para heridas, consulte las instrucciones de cuidado de la aspiradora para heridas.   ACTIVIDADES segn tolerancia Inicie actividades diarias livianas (cuidado personal, caminar, subir escaleras) a partir del da posterior a la ciruga. Aumente gradualmente las actividades segn lo tolere. Controla tu dolor para estar activo. Detngase cuando est cansado. Lo ideal es caminar varias veces al da, eventualmente una hora al da. La mayora de las personas regresan a la mayora de las actividades cotidianas en unas pocas semanas. Se necesitan de 4 a 8 semanas para volver a la actividad intensa y sin restricciones. Si puede caminar 30 minutos sin dificultad, es seguro intentar una actividad ms intensa como trotar, caminar en una caminadora, andar en bicicleta, ejercicios aerbicos de bajo impacto, nadar, etc. Guarde la actividad ms intensa y extenuante para el final (por lo general, de 4 a 8 semanas despus de la ciruga), como abdominales, levantar objetos pesados, deportes de contacto, etc. Abstngase de cualquier trabajo pesado intenso o esfuerzo hasta que deje de tomar narcticos para controlar el dolor. Tendr das libres, pero las cosas deberan mejorar semana a semana. NO EMPUJE A TRAVS DEL DOLOR. Deja que el dolor sea tu gua: si te duele hacer algo, no lo hagas. El dolor es tu cuerpo advirtindote que evites esa actividad durante otra semana hasta que el dolorbaja. Puede conducir cuando ya no est tomando medicamentos narcticos recetados para el dolor, puede usar cmodamente el cinturn de seguridad y puede hacer giros/paradas repentinos de manera segura para protegerse sin vacilar debido al  dolor. Puede tener relaciones sexuales cuando le resulte cmodo. Si te duele hacer algo, detente.  MEDICAMENTOS Tome los medicamentos que le recetan normalmente en casa, a menos que le indiquen lo contrario. Anticoagulantes: Por lo general, puede reiniciar cualquier anticoagulante fuerte despus del segundo da posoperatorio. Est bien tomar una aspirina de inmediato.   Si est tomando anticoagulantes fuertes (warfarina/Coumadin, Plavix, Xerelto, Eliquis, Pradaxa, etc.), hable con su cirujano, mdico de atencin primaria y/o cardilogo para obtener instrucciones sobre cundo reiniciar el anticoagulante y si es necesario controlar la sangre. (anlisis de sangre PT/INR, etc.).  CONTROL DE DOLOR El dolor despus de la ciruga o relacionado con la actividad a menudo se debe a una tensin/lesin en el msculo, tendn, nervios y/o incisiones. Este dolor suele ser a corto plazo y mejorar en unos pocos meses. Para ayudar a acelerar el proceso de curacin y volver a la actividad normal ms rpidamente, HAGAN LAS SIGUIENTES COSAS JUNTOS: 1. Aumente la actividad gradualmente. NO EMPUJE A TRAVS DEL DOLOR 2. Usa Hielo y/o Calor 3. Prueba masajes suaves y/o estiramientos 4. Tome analgsicos de venta libre 5. Tome analgsicos recetados con narcticos para el dolor ms intenso  Buen control del dolor = recuperacin ms rpida. Es mejor tomar ms medicamentos para estar ms activo que quedarse en cama todo el da para evitar los medicamentos. 1. Incrementa la actividad gradualmente Evite levantar objetos pesados al principio, luego aumente a levantar segn lo tolere durante las prximas 6 semanas. No "empuje a travs" del dolor. Escucha a tu cuerpo y evita posiciones y maniobras que reproduzcan el dolor. Espera unos das antes de probar   algo ms intenso. Se recomienda caminar una hora al da para ayudar a que su cuerpo se recupere ms rpido y de manera ms segura. Comience lentamente y detngase cuando  sienta dolor. Si puedes caminar 30 minutos sin parar ni sentir dolor, puedes probar con actividades ms intensas (correr, trotar, aerbicos, andar en bicicleta, nadar, caminadora, sexo, deportes, levantamiento de pesas, etc.) Recuerda: si te duele hacerlo, no lo hagas! 2. Usa Hielo y/o Calor Tendr hinchazn y moretones alrededor de las incisiones. Esto tardar varias semanas en resolverse. Bolsas de hielo o almohadillas trmicas (6 a 8 veces al da, 30 a 60 minutos cada vez) ayudarn a aliviar el dolor y los moretones. Algunas personas prefieren usar hielo solo, calor solo o alternar entre hielo y calor. Experimente y vea qu funciona mejor para usted. Considere probar el hielo durante los primeros das para ayudar a disminuir la hinchazn y los moretones; luego, cambie a calor para ayudar a relajar los puntos doloridos y acelerar la recuperacin. Dchate todos los das. Los baos cortos estn bien. Se siente bien! Mantenga las incisiones y heridas limpias con agua y jabn. 3. Prueba masajes suaves y/o estiramientos Masaje en el rea del dolor muchas veces al da Detente si sientes dolor, no te excedas. 4. Tome analgsicos de venta libre Esto ayuda a que los tejidos musculares y nerviosos se vuelvan menos irritables y se calmen ms rpido. Elija UNO de los siguientes medicamentos antiinflamatorios de venta libre: Pastillas de 500 mg de acetaminofn (Tylenol) 1 o 2 pastillas con cada comida y justo antes de acostarse (evtelo si tiene problemas hepticos o si tiene acetaminofn en su receta de narcticos) Tabletas de naproxeno de 220 mg (p. ej., Aleve, Naprosyn) 1 o 2 tabletas dos veces al da (evtela si tiene problemas de rin, estmago, EII o sangrado) Pastillas de 200 mg de ibuprofeno (p. ej., Advil, Motrin) 3 o 4 pastillas con cada comida y justo antes de acostarse (evtelo si tiene problemas de rin, estmago, EII o sangrado) Tmelo con comida/refrigerio varias veces al da segn las  indicaciones durante al menos 2 semanas para ayudar a mantener el dolor/dolor bajo y ms manejable. 5. Tome analgsicos recetados con narcticos para el dolor ms intenso A menudo se le da una receta para un fuerte control del dolor despus del alta (por ejemplo: oxicodona/Percocet, hidrocodona/Norco/Vicodin o tramadol/Ultram) Tome su medicamento para el dolor segn lo prescrito. Tenga en cuenta que la mayora de las recetas de narcticos tambin contienen Tylenol (acetaminofeno); evite tomar demasiado Tylenol. Si tiene problemas/inquietudes con el medicamento recetado (no controla el dolor, las nuseas, los vmitos, el sarpullido, la picazn, etc.), llmenos al [phone] para ver si necesitamos cambiarlo por otro para el dolor. medicamento que funcionar mejor para usted y/o controlar mejor sus efectos secundarios. Si necesita un resurtido de su medicamento para el dolor, debe llamar a la oficina antes de las 4:00 p. m. y solo entre semana. Por ley federal, las recetas de narcticos no se pueden pedir en una farmacia. Deben ser llenados en papel y recogidos en nuestra oficina por el paciente o cuidador autorizado. Las recetas no se pueden surtir despus de las 4 pm ni los fines de semana.  CUANDO LLAMARNOS [phone] Dolor intenso no controlado o que empeora Fiebre superior a 101 F (38,5 C) Inquietudes con la incisin: Empeoramiento del dolor, enrojecimiento, sarpullido/urticaria, hinchazn, sangrado o drenaje Reacciones/problemas con nuevos medicamentos (picazn, sarpullido, urticaria, nuseas, etc.) Nuseas y d/o vmitos Dificultad para orinar Respiracin dificultosa Empeoramiento de la fatiga,   mareos, aturdimiento, visin borrosa Otras preocupaciones Si no mejora despus de dos semanas o nota que empeora, comunquese con nuestra oficina al [phone] para obtener ms consejos. Es posible que necesitemos ajustar sus medicamentos, volver a evaluarlo en el consultorio, enviarlo a la  sala de emergencias o ver qu otras cosas podemos hacer para ayudarlo. El personal de la clnica est disponible para responder sus preguntas durante el horario comercial habitual (8:30 a. m. a 5 p. m.). No dude en llamar y pedir hablar con uno de nuestros enfermeros si tiene inquietudes clnicas. Un cirujano de [facility] est siempre de guardia en los hospitales las 24 horas del da.  Si tiene una emergencia mdica, vaya a la sala de emergencias ms cercana o llame al 911.  SEGUIMIENTO en nuestra oficina El da de su alta del hospital (o el siguiente da laborable de la semana), llame a [facility] para programar o confirmar una cita para ver a su cirujano en el consultorio para una cita de seguimiento. Por lo general, es de 2 a 3 semanas despus de la ciruga. Si tiene grapas para la piel en su(s) incisin(es), infrmele al consultorio para que podamos programar un horario en el consultorio para que la enfermera se las quite (generalmente alrededor de 10 das despus de la ciruga). Asegrese de llamar para programar citas el da del alta (o el siguiente da laborable de la semana) del hospital para garantizar una hora de cita conveniente. SI TIENE FORMULARIOS DE DISCAPACIDAD O LICENCIA FAMILIAR, LLEVARLOS A LA OFICINA PARA PROCESARLOS. NO SE LOS D A SU MDICO.  Ciruga de [facility], [place] [address], [address], [place], [place] [address] ? [phone] - Principal [phone] - Nmero gratuito, [phone] - Fax [email or web address]   LLEGAR A UNA BUENA SALUD INTESTINAL. Se espera que su tracto digestivo necesite algunos meses para volver a la normalidad. Es comn que sus evacuaciones intestinales y heces sean irregulares. Tendr hinchazn y calambres ocasionales que eventualmente desaparecern. Hasta que est comiendo alimentos slidos normalmente, deje de tomar todos los medicamentos para el dolor y regrese a sus actividades  regulares; sus intestinos no sern normales. Evitar el estreimiento El objetivo: UNA EVACUACIN INTESTINAL SUAVE AL DA! Beber mucho lquido. Elige agua primero. TOMA UN SUPLEMENTO DE FIBRA CADA DA EL RESTO DE TU VIDA Durante su primera semana de regreso a casa, agregue gradualmente un suplemento de fibra todos los das. Experimente qu forma puede tolerar. Hay muchas formas, como polvos, tabletas, obleas, gomitas, etc. Salvado de psyllium (Metamucil), metilcelulosa (Citrucel), Miralax o Glycolax, Benefiber, Linaza. Ajuste la dosis semana a semana (1/2 dosis/da a 6 dosis al da) hasta que est defecando 1-2 veces al da. Reduzca la dosis o pruebe con un producto de fibra diferente si le causa problemas como diarrea o distensin abdominal. A veces, se necesita un laxante para ayudar a impulsar los intestinos si est estreido hasta que el suplemento de fibra pueda ayudar a regular sus intestinos. Si tolera comer y se est tirando pedos, est bien probar un laxante suave como la dosis doble de MiraLax, jugo de ciruela pasa o leche de magnesia. Evite el uso de laxantes con demasiada frecuencia. Los ablandadores de heces a veces pueden ayudar a contrarrestar los efectos de estreimiento de los analgsicos narcticos. Tambin puede causar diarrea, as que evite usarlo por mucho tiempo. Si todava est estreido a pesar de tomar fibra diariamente, comer slidos y algunas dosis de laxantes, llame a nuestra oficina. Controlando la diarrea Intente   beber lquidos y comer alimentos blandos durante unos das para evitar estresar an ms sus intestinos. Evite los productos lcteos (especialmente la leche y el helado) por un corto tiempo. Los intestinos a menudo pueden perder la capacidad de digerir la lactosa cuando estn estresados. Evite los alimentos que causan gases o hinchazn. Los alimentos tpicos incluyen frijoles y otras legumbres, repollo, brcoli y productos lcteos. Evite las comidas grasosas,  picantes y rpidas. Cada persona tiene cierta sensibilidad a otros alimentos, as que escuche a su cuerpo y evite aquellos alimentos que le provoquen problemas. Los probiticos (como el yogur activo, Align, etc.) pueden ayudar a repoblar los intestinos y el colon con bacterias normales y calmar un tracto digestivo sensible Agregar un suplemento de fibra gradualmente puede ayudar a espesar las heces al absorber el exceso de lquido y volver a entrenar a los intestinos para que acten con ms normalidad. Aumente lentamente la dosis durante unas pocas semanas. Demasiada fibra demasiado pronto puede ser contraproducente y causar calambres e hinchazn. Est bien tratar de disminuir la diarrea con unas pocas dosis de medicamentos antidiarreicos. El subsalicilato de bismuto (por ejemplo, Kayopectate, Pepto Bismol) en algunas dosis puede ayudar a controlar la diarrea. Evitar si est embarazada. La loperamida (Imodium) puede retrasar la diarrea. Comience con una tableta (2 mg) primero. Evtelo si tiene fiebre o dolor intenso. LOS PACIENTES CON ILEOSTOMA TENDRN DIARREA CRNICA ya que su colon no est en uso. Beba muchos lquidos. Deber beber incluso ms vasos de agua/lquido al da para evitar deshidratarse. Registre el resultado de su ileostoma. Espere vaciar la bolsa cada 3-4 horas al principio. La mayora de las personas con una ileostoma permanente vace su bolsa 4-6 veces como mnimo. Use medicamentos antidiarreicos (especialmente Imodium) varias veces al da para evitar deshidratarse. Comience con una dosis a la hora de acostarse y desayunar. Ajuste hacia arriba o hacia abajo segn sea necesario. Aumente los medicamentos antidiarreicos segn las indicaciones para evitar vaciar la bolsa ms de 8 veces al da (cada 3 horas). Trabaje con su enfermera de ostoma de heridas para aprender a cuidar su ostoma. Consulte las instrucciones de cuidado de la ostoma. RESOLUCIN DE PROBLEMAS DE INTESTINOS  IRREGULARES 1) Comience con una dieta suave y blanda. Nada de comidas picantes, grasosas o fritas. 2) Evite el gluten/trigo o productos lcteos de la dieta para ver si los sntomas mejoran. 3) Miralax 17gm o semillas de lino mezcladas en 8oz. agua o jugo-diariamente. Puede usar 2-4 veces al da segn sea necesario. 4) Gas-X, Phazyme, etc. segn sea necesario para gases e hinchazn. 5) Prilosec (omeprazol) de venta libre segn sea necesario 6) Considere los probiticos (Align, Activa, etc.) para ayudar a calmar los intestinos  Llame a su mdico si est empeorando o no est mejorando. A veces, se pueden necesitar ms pruebas (cultivos, endoscopia, estudios de rayos X, tomografas computarizadas, anlisis de sangre, etc.) para ayudar a diagnosticar y tratar la causa de la diarrea. Ciruga de [facility], [place] [address], [address], [place], [place] [address] [phone] - Principal. [phone] - Llamada gratuita. [phone] - Fax [email or web address]   ##########################################  SURGERY: POST OP INSTRUCTIONS (Surgery for small bowel obstruction, colon resection, etc)   ######################################################################  EAT Gradually transition to a high fiber diet with a fiber supplement over the next few days after discharge  WALK Walk an hour a day.  Control your pain to do that.    CONTROL PAIN Control pain so that you can walk, sleep, tolerate sneezing/coughing, go up/down stairs.    HAVE A BOWEL MOVEMENT DAILY Keep your bowels regular to avoid problems.  OK to try a laxative to override constipation.  OK to use an antidairrheal to slow down diarrhea.  Call if not better after 2 tries  CALL IF YOU HAVE PROBLEMS/CONCERNS Call if you are still struggling despite following these instructions. Call if you have concerns not answered by these  instructions  ######################################################################   DIET Follow a light diet the first few days at home.  Start with a bland diet such as soups, liquids, starchy foods, low fat foods, etc.  If you feel full, bloated, or constipated, stay on a ful liquid or pureed/blenderized diet for a few days until you feel better and no longer constipated. Be sure to drink plenty of fluids every day to avoid getting dehydrated (feeling dizzy, not urinating, etc.). Gradually add a fiber supplement to your diet over the next week.  Gradually get back to a regular solid diet.  Avoid fast food or heavy meals the first week as you are more likely to get nauseated. It is expected for your digestive tract to need a few months to get back to normal.  It is common for your bowel movements and stools to be irregular.  You will have occasional bloating and cramping that should eventually fade away.  Until you are eating solid food normally, off all pain medications, and back to regular activities; your bowels will not be normal. Focus on eating a low-fat, high fiber diet the rest of your life (See Getting to Good Bowel Health, below).  CARE of your INCISION or WOUND  It is good for closed incisions and even open wounds to be washed every day.  Shower every day.  Short baths are fine.  Wash the incisions and wounds clean with soap & water.    You may leave closed incisions open to air if it is dry.   You may cover the incision with clean gauze & replace it after your daily shower for comfort.  If you have an open wound with a wound vac, see wound vac care instructions.    ACTIVITIES as tolerated Start light daily activities --- self-care, walking, climbing stairs-- beginning the day after surgery.  Gradually increase activities as tolerated.  Control your pain to be active.  Stop when you are tired.  Ideally, walk several times a day, eventually an hour a day.   Most people are back  to most day-to-day activities in a few weeks.  It takes 4-8 weeks to get back to unrestricted, intense activity. If you can walk 30 minutes without difficulty, it is safe to try more intense activity such as jogging, treadmill, bicycling, low-impact aerobics, swimming, etc. Save the most intensive and strenuous activity for last (Usually 4-8 weeks after surgery) such as sit-ups, heavy lifting, contact sports, etc.  Refrain from any intense heavy lifting or straining until you are off narcotics for pain control.  You will have off days, but things should improve week-by-week. DO NOT PUSH THROUGH PAIN.  Let pain be your guide: If it hurts to do something, don't do it.  Pain is your body warning you to avoid that activity for another week until the pain goes down. You may drive when you are no longer taking narcotic prescription pain medication, you can comfortably wear a seatbelt, and you can safely make sudden turns/stops to protect yourself without hesitating due to pain. You may have sexual intercourse when it is comfortable. If it hurts to   do something, stop.  MEDICATIONS Take your usually prescribed home medications unless otherwise directed.   Blood thinners:  Usually you can restart any strong blood thinners after the second postoperative day.  It is OK to take aspirin right away.     If you are on strong blood thinners (warfarin/Coumadin, Plavix, Xerelto, Eliquis, Pradaxa, etc), discuss with your surgeon, medicine PCP, and/or cardiologist for instructions on when to restart the blood thinner & if blood monitoring is needed (PT/INR blood check, etc).     PAIN CONTROL Pain after surgery or related to activity is often due to strain/injury to muscle, tendon, nerves and/or incisions.  This pain is usually short-term and will improve in a few months.  To help speed the process of healing and to get back to regular activity more quickly, DO THE FOLLOWING THINGS TOGETHER: Increase activity gradually.   DO NOT PUSH THROUGH PAIN Use Ice and/or Heat Try Gentle Massage and/or Stretching Take over the counter pain medication Take Narcotic prescription pain medication for more severe pain  Good pain control = faster recovery.  It is better to take more medicine to be more active than to stay in bed all day to avoid medications.  Increase activity gradually Avoid heavy lifting at first, then increase to lifting as tolerated over the next 6 weeks. Do not "push through" the pain.  Listen to your body and avoid positions and maneuvers than reproduce the pain.  Wait a few days before trying something more intense Walking an hour a day is encouraged to help your body recover faster and more safely.  Start slowly and stop when getting sore.  If you can walk 30 minutes without stopping or pain, you can try more intense activity (running, jogging, aerobics, cycling, swimming, treadmill, sex, sports, weightlifting, etc.) Remember: If it hurts to do it, then don't do it! Use Ice and/or Heat You will have swelling and bruising around the incisions.  This will take several weeks to resolve. Ice packs or heating pads (6-8 times a day, 30-60 minutes at a time) will help sooth soreness & bruising. Some people prefer to use ice alone, heat alone, or alternate between ice & heat.  Experiment and see what works best for you.  Consider trying ice for the first few days to help decrease swelling and bruising; then, switch to heat to help relax sore spots and speed recovery. Shower every day.  Short baths are fine.  It feels good!  Keep the incisions and wounds clean with soap & water.   Try Gentle Massage and/or Stretching Massage at the area of pain many times a day Stop if you feel pain - do not overdo it Take over the counter pain medication This helps the muscle and nerve tissues become less irritable and calm down faster Choose ONE of the following over-the-counter anti-inflammatory medications: Acetaminophen  500mg tabs (Tylenol) 1-2 pills with every meal and just before bedtime (avoid if you have liver problems or if you have acetaminophen in you narcotic prescription) Naproxen 220mg tabs (ex. Aleve, Naprosyn) 1-2 pills twice a day (avoid if you have kidney, stomach, IBD, or bleeding problems) Ibuprofen 200mg tabs (ex. Advil, Motrin) 3-4 pills with every meal and just before bedtime (avoid if you have kidney, stomach, IBD, or bleeding problems) Take with food/snack several times a day as directed for at least 2 weeks to help keep pain / soreness down & more manageable. Take Narcotic prescription pain medication for more severe pain A prescription for   strong pain control is often given to you upon discharge (for example: oxycodone/Percocet, hydrocodone/Norco/Vicodin, or tramadol/Ultram) Take your pain medication as prescribed. Be mindful that most narcotic prescriptions contain Tylenol (acetaminophen) as well - avoid taking too much Tylenol. If you are having problems/concerns with the prescription medicine (does not control pain, nausea, vomiting, rash, itching, etc.), please call us [phone] to see if we need to switch you to a different pain medicine that will work better for you and/or control your side effects better. If you need a refill on your pain medication, you must call the office before 4 pm and on weekdays only.  By federal law, prescriptions for narcotics cannot be called into a pharmacy.  They must be filled out on paper & picked up from our office by the patient or authorized caretaker.  Prescriptions cannot be filled after 4 pm nor on weekends.    WHEN TO CALL US [phone] Severe uncontrolled or worsening pain  Fever over 101 F (38.5 C) Concerns with the incision: Worsening pain, redness, rash/hives, swelling, bleeding, or drainage Reactions / problems with new medications (itching, rash, hives, nausea, etc.) Nausea and/or vomiting Difficulty urinating Difficulty  breathing Worsening fatigue, dizziness, lightheadedness, blurred vision Other concerns If you are not getting better after two weeks or are noticing you are getting worse, contact our office [phone] for further advice.  We may need to adjust your medications, re-evaluate you in the office, send you to the emergency room, or see what other things we can do to help. The clinic staff is available to answer your questions during regular business hours (8:30am-5pm).  Please don't hesitate to call and ask to speak to one of our nurses for clinical concerns.    A surgeon from [facility] is always on call at the hospitals 24 hours/day If you have a medical emergency, go to the nearest emergency room or call 911.  FOLLOW UP in our office One the day of your discharge from the hospital (or the next business weekday), please call [facility] to set up or confirm an appointment to see your surgeon in the office for a follow-up appointment.  Usually it is 2-3 weeks after your surgery.   If you have skin staples at your incision(s), let the office know so we can set up a time in the office for the nurse to remove them (usually around 10 days after surgery). Make sure that you call for appointments the day of discharge (or the next business weekday) from the hospital to ensure a convenient appointment time. IF YOU HAVE DISABILITY OR FAMILY LEAVE FORMS, BRING THEM TO THE OFFICE FOR PROCESSING.  DO NOT GIVE THEM TO YOUR DOCTOR.  [facility] [address], [address], [place], [place]  [address] ? [phone] - Main [phone] - Toll Free,  [phone] - Fax [email or web address]    GETTING TO GOOD BOWEL HEALTH. It is expected for your digestive tract to need a few months to get back to normal.  It is common for your bowel movements and stools to be irregular.  You will have occasional bloating and cramping that should eventually fade  away.  Until you are eating solid food normally, off all pain medications, and back to regular activities; your bowels will not be normal.   Avoiding constipation The goal: ONE SOFT BOWEL MOVEMENT A DAY!    Drink plenty of fluids.  Choose water first. TAKE A FIBER SUPPLEMENT EVERY DAY   THE REST OF YOUR LIFE During your first week back home, gradually add back a fiber supplement every day Experiment which form you can tolerate.   There are many forms such as powders, tablets, wafers, gummies, etc Psyllium bran (Metamucil), methylcellulose (Citrucel), Miralax or Glycolax, Benefiber, Flax Seed.  Adjust the dose week-by-week (1/2 dose/day to 6 doses a day) until you are moving your bowels 1-2 times a day.  Cut back the dose or try a different fiber product if it is giving you problems such as diarrhea or bloating. Sometimes a laxative is needed to help jump-start bowels if constipated until the fiber supplement can help regulate your bowels.  If you are tolerating eating & you are farting, it is okay to try a gentle laxative such as double dose MiraLax, prune juice, or Milk of Magnesia.  Avoid using laxatives too often. Stool softeners can sometimes help counteract the constipating effects of narcotic pain medicines.  It can also cause diarrhea, so avoid using for too long. If you are still constipated despite taking fiber daily, eating solids, and a few doses of laxatives, call our office. Controlling diarrhea Try drinking liquids and eating bland foods for a few days to avoid stressing your intestines further. Avoid dairy products (especially milk & ice cream) for a short time.  The intestines often can lose the ability to digest lactose when stressed. Avoid foods that cause gassiness or bloating.  Typical foods include beans and other legumes, cabbage, broccoli, and dairy foods.  Avoid greasy, spicy, fast foods.  Every person has some sensitivity to other foods, so listen to your body and avoid those  foods that trigger problems for you. Probiotics (such as active yogurt, Align, etc) may help repopulate the intestines and colon with normal bacteria and calm down a sensitive digestive tract Adding a fiber supplement gradually can help thicken stools by absorbing excess fluid and retrain the intestines to act more normally.  Slowly increase the dose over a few weeks.  Too much fiber too soon can backfire and cause cramping & bloating. It is okay to try and slow down diarrhea with a few doses of antidiarrheal medicines.   Bismuth subsalicylate (ex. Kayopectate, Pepto Bismol) for a few doses can help control diarrhea.  Avoid if pregnant.   Loperamide (Imodium) can slow down diarrhea.  Start with one tablet (2mg) first.  Avoid if you are having fevers or severe pain.  ILEOSTOMY PATIENTS WILL HAVE CHRONIC DIARRHEA since their colon is not in use.    Drink plenty of liquids.  You will need to drink even more glasses of water/liquid a day to avoid getting dehydrated. Record output from your ileostomy.  Expect to empty the bag every 3-4 hours at first.  Most people with a permanent ileostomy empty their bag 4-6 times at the least.   Use antidiarrheal medicine (especially Imodium) several times a day to avoid getting dehydrated.  Start with a dose at bedtime & breakfast.  Adjust up or down as needed.  Increase antidiarrheal medications as directed to avoid emptying the bag more than 8 times a day (every 3 hours). Work with your wound ostomy nurse to learn care for your ostomy.  See ostomy care instructions. TROUBLESHOOTING IRREGULAR BOWELS 1) Start with a soft & bland diet. No spicy, greasy, or fried foods.  2) Avoid gluten/wheat or dairy products from diet to see if symptoms improve. 3) Miralax 17gm or flax seed mixed in 8oz. water or juice-daily. May use 2-4 times a   day as needed. 4) Gas-X, Phazyme, etc. as needed for gas & bloating.  5) Prilosec (omeprazole) over-the-counter as needed 6)  Consider  probiotics (Align, Activa, etc) to help calm the bowels down  Call your doctor if you are getting worse or not getting better.  Sometimes further testing (cultures, endoscopy, X-ray studies, CT scans, bloodwork, etc.) may be needed to help diagnose and treat the cause of the diarrhea. [facility] [address], [address], [place], [place]  [address] [phone] - Main.    [phone]  - Toll Free.   [phone] - Fax [email or web address]   #######################################################  Ostomy Support Information  You've heard that people get along just fine with only one of their eyes, or one of their lungs, or one of their kidneys. But you also know that you have only one intestine and only one bladder, and that leaves you feeling awfully empty, both physically and emotionally: You think no other people go around without part of their intestine with the ends of their intestines sticking out through their abdominal walls.   YOU ARE NOT ALONE.  There are nearly three quarters of a million people in the [place] who have an ostomy; people who have had surgery to remove all or part of their colons or bladders.   There is even a national association, the [employer] with over 350 local affiliated support groups that are organized by volunteers who provide peer support and counseling. [employer] has a toll free telephone num-ber, 800-826-0826 and an educational, interactive website, www.ostomy.org   An ostomy is an opening in the belly (abdominal wall) made by surgery. Ostomates are people who have had this procedure. The opening (stoma) allows the kidney or bowel to grdischarge waste. An external pouch covers the stoma to collect waste. Pouches are are a simple bag and are odor free. Different companies have disposable or reusable pouches to fit one's lifestyle. An ostomy can either be temporary or permanent.   THERE ARE THREE MAIN  TYPES OF OSTOMIES Colostomy. A colostomy is a surgically created opening in the large intestine (colon). Ileostomy. An ileostomy is a surgically created opening in the small intestine. Urostomy. A urostomy is a surgically created opening to divert urine away from the bladder.  OSTOMY Care  The following guidelines will make care of your colostomy easier. Keep this information close by for quick reference.  Helpful DIET hints Eat a well-balanced diet including vegetables and fresh fruits. Eat on a regular schedule.  Drink at least 6 to 8 glasses of fluids daily. Eat slowly in a relaxed atmosphere. Chew your food thoroughly. Avoid chewing gum, smoking, and drinking from a straw. This will help decrease the amount of air you swallow, which may help reduce gas. Eating yogurt or drinking buttermilk may help reduce gas.  To control gas at night, do not eat after 8 p.m. This will give your bowel time to quiet down before you go to bed.  If gas is a problem, you can purchase Beano. Sprinkle Beano on the first bite of food before eating to reduce gas. It has no flavor and should not change the taste of your food. You can buy Beano over the counter at your local drugstore.  Foods like fish, onions, garlic, broccoli, asparagus, and cabbage produce odor. Although your pouch is odor-proof, if you eat these foods you may notice a stronger odor when emptying your pouch. If this is a concern, you may want to   limit these foods in your diet.  If you have an ileostomy, you will have chronic diarrhea & need to drink more liquids to avoid getting dehydrated.  Consider antidiarrheal medicine like imodium (loperamide) or Lomotil to help slow down bowel movements / diarrhea into your ileostomy bag.  GETTING TO GOOD BOWEL HEALTH WITH AN ILEOSTOMY    With the colon bypassed & not in use, you will have small bowel diarrhea.   It is important to thicken & slow your bowel movements down.   The goal: 4-6 small BOWEL  MOVEMENTS A DAY It is important to drink plenty of liquids to avoid getting dehydrated  CONTROLLING ILEOSTOMY DIARRHEA  TAKE A FIBER SUPPLEMENT (FiberCon or Benefiner soluble fiber) twice a day - to thicken stools by absorbing excess fluid and retrain the intestines to act more normally.  Slowly increase the dose over a few weeks.  Too much fiber too soon can backfire and cause cramping & bloating.  TAKE AN IRON SUPPLEMENT twice a day to naturally constipate your bowels.  Usually ferrous sulfate 325mg twice a day)  TAKE ANTI-DIARRHEAL MEDICINES: Loperamide (Imodium) can slow down diarrhea.  Start with two tablets (= 4mg) first and then try one tablet every 6 hours.  Can go up to 2 pills four times day (8 pills of 2mg max) Avoid if you are having fevers or severe pain.  If you are not better or start feeling worse, stop all medicines and call your doctor for advice LoMotil (Diphenoxylate / Atropine) is another medicine that can constipate & slow down bowel moevements Pepto Bismol (bismuth) can gently thicken bowels as well  If diarrhea is worse,: drink plenty of liquids and try simpler foods for a few days to avoid stressing your intestines further. Avoid dairy products (especially milk & ice cream) for a short time.  The intestines often can lose the ability to digest lactose when stressed. Avoid foods that cause gassiness or bloating.  Typical foods include beans and other legumes, cabbage, broccoli, and dairy foods.  Every person has some sensitivity to other foods, so listen to our body and avoid those foods that trigger problems for you.Call your doctor if you are getting worse or not better.  Sometimes further testing (cultures, endoscopy, X-ray studies, bloodwork, etc) may be needed to help diagnose and treat the cause of the diarrhea. Take extra anti-diarrheal medicines (maximum is 8 pills of 2mg loperamide a day)   Tips for POUCHING an OSTOMY   Changing Your Pouch The best time to  change your pouch is in the morning, before eating or drinking anything. Your stoma can function at any time, but it will function more after eating or drinking.   Applying the pouching system  Place all your equipment close at hand before removing your pouch.  Wash your hands.  Stand or sit in front of a mirror. Use the position that works best for you. Remember that you must keep the skin around the stoma wrinkle-free for a good seal.  Gently remove the used pouch (1-piece system) or the pouch and old wafer (2-piece system). Empty the pouch into the toilet. Save the closure clip to use again.  Wash the stoma itself and the skin around the stoma. Your stoma may bleed a little when being washed. This is normal. Rinse and pat dry. You may use a wash cloth or soft paper towels (like Bounty), mild soap (like Dial, Safeguard, or Ivory), and water. Avoid soaps that contain perfumes or lotions.    For a new pouch (1-piece system) or a new wafer (2-piece system), measure your stoma using the stoma guide in each box of supplies.  Trace the shape of your stoma onto the back of the new pouch or the back of the new wafer. Cut out the opening. Remove the paper backing and set it aside.  Optional: Apply a skin barrier powder to surrounding skin if it is irritated (bare or weeping), and dust off the excess. Optional: Apply a skin-prep wipe (such as Skin Prep or All-Kare) to the skin around the stoma, and let it dry. Do not apply this solution if the skin is irritated (red, tender, or broken) or if you have shaved around the stoma. Optional: Apply a skin barrier paste (such as Stomahesive, Coloplast, or Premium) around the opening cut in the back of the pouch or wafer. Allow it to dry for 30 to 60 seconds.  Hold the pouch (1-piece system) or wafer (2-piece system) with the sticky side toward your body. Make sure the skin around the stoma is wrinkle-free. Center the opening on the stoma, then press firmly to  your abdomen (Fig. 4). Look in the mirror to check if you are placing the pouch, or wafer, in the right position. For a 2-piece system, snap the pouch onto the wafer. Make sure it snaps into place securely.  Place your hand over the stoma and the pouch or wafer for about 30 seconds. The heat from your hand can help the pouch or wafer stick to your skin.  Add deodorant (such as Super Banish or Nullo) to your pouch. Other options include food extracts such as vanilla oil and peppermint extract. Add about 10 drops of the deodorant to the pouch. Then apply the closure clamp. Note: Do not use toxic  chemicals or commercial cleaning agents in your pouch. These substances may harm the stoma.  Optional: For extra seal, apply tape to all 4 sides around the pouch or wafer, as if you were framing a picture. You may use any brand of medical adhesive tape. Change your pouch every 5 to 7 days. Change it immediately if a leak occurs.  Wash your hands afterwards.  If you are wearing a 2-piece system, you may use 2 new pouches per week and alternate them. Rinse the pouch with mild soap and warm water and hang it to dry for the next day. Apply the fresh pouch. Alternate the 2 pouches like this for a week. After a week, change the wafer and begin with 2 new pouches. Place the old pouches in a plastic bag, and put them in the trash.   LIVING WITH AN OSTOMY  Emptying Your Pouch Empty your pouch when it is one-third full (of urine, stool, and/or gas). If you wait until your pouch is fuller than this, it will be more difficult to empty and more noticeable. When you empty your pouch, either put toilet paper in the toilet bowl first, or flush the toilet while you empty the pouch. This will reduce splashing. You can empty the pouch between your legs or to one side while sitting, or while standing or stooping. If you have a 2-piece system, you can snap off the pouch to empty it. Remember that your stoma may function during  this time. If you wish to rinse your pouch after you empty it, a turkey baster can be helpful. When using a baster, squirt water up into the pouch through the opening at the bottom. With a 2-piece system,   you can snap off the pouch to rinse it. After rinsing  your pouch, empty it into the toilet. When rinsing your pouch at home, put a few granules of Dreft soap in the rinse water. This helps lubricate and freshen your pouch. The inside of your pouch can be sprayed with non-stick cooking oil (Pam spray). This may help reduce stool sticking to the inside of the pouch.  Bathing You may shower or bathe with your pouch on or off. Remember that your stoma may function during this time.  The materials you use to wash your stoma and the skin around it should be clean, but they do not need to be sterile.  Wearing Your Pouch During hot weather, or if you perspire a lot in general, wear a cover over your pouch. This may prevent a rash on your skin under the pouch. Pouch covers are sold at ostomy supply stores. Wear the pouch inside your underwear for better support. Watch your weight. Any gain or loss of 10 to 15 pounds or more can change the way your pouch fits.  Going Away From Home A collapsible cup (like those that come in travel kits) or a soft plastic squirt bottle with a pull-up top (like a travel bottle for shampoo) can be used for rinsing your pouch when you are away from home. Tilt the opening of the pouch at an upward angle when using a cup to rinse.  Carry wet wipes or extra tissues to use in public bathrooms.  Carry an extra pouching system with you at all times.  Never keep ostomy supplies in the glove compartment of your car. Extreme heat or cold can damage the skin barriers and adhesive wafers on the pouch.  When you travel, carry your ostomy supplies with you at all times. Keep them within easy reach. Do not pack ostomy supplies in baggage that will be checked or otherwise separated  from you, because your baggage might be lost. If you're traveling out of the country, it is helpful to have a letter stating that you are carrying ostomy supplies as a medical necessity.  If you need ostomy supplies while traveling, look in the yellow pages of the telephone book under "Surgical Supplies." Or call the local ostomy organization to find out where supplies are available.  Do not let your ostomy supplies get low. Always order new pouches before you use the last one.  Reducing Odor Limit foods such as broccoli, cabbage, onions, fish, and garlic in your diet to help reduce odor. Each time you empty your pouch, carefully clean the opening of the pouch, both inside and outside, with toilet paper. Rinse your pouch 1 or 2 times daily after you empty it (see directions for emptying your pouch and going away from home). Add deodorant (such as Super Banish or Nullo) to your pouch. Use air deodorizers in your bathroom. Do not add aspirin to your pouch. Even though aspirin can help prevent odor, it could cause ulcers on your stoma.  When to call the doctor Call the doctor if you have any of the following symptoms: Purple, black, or white stoma Severe cramps lasting more than 6 hours Severe watery discharge from the stoma lasting more than 6 hours No output from the colostomy for 3 days Excessive bleeding from your stoma Swelling of your stoma to more than 1/2-inch larger than usual Pulling inward of your stoma below skin level Severe skin irritation or deep ulcers Bulging or other changes in your abdomen    When to call your ostomy nurse Call your ostomy/enterostomal therapy (WOCN) nurse if any of the following occurs: Frequent leaking of your pouching system Change in size or appearance of your stoma, causing discomfort or problems with your pouch Skin rash or rawness Weight gain or loss that causes problems with your pouch     FREQUENTLY ASKED QUESTIONS   Why haven't you met  any of these folks who have an ostomy?  Well, maybe you have! You just did not recognize them because an ostomy doesn't show. It can be kept secret if you wish. Why, maybe some of your best friends, office associates or neighbors have an ostomy ... you never can tell. People facing ostomy surgery have many quality-of-life questions like: Will you bulge? Smell? Make noises? Will you feel waste leaving your body? Will you be a captive of the toilet? Will you starve? Be a social outcast? Get/stay married? Have babies? Easily bathe, go swimming, bend over?  OK, let's look at what you can expect:   Will you bulge?  Remember, without part of the intestine or bladder, and its contents, you should have a flatter tummy than before. You can expect to wear, with little exception, what you wore before surgery ... and this in-cludes tight clothing and bathing suits.   Will you smell?  Today, thanks to modern odor proof pouching systems, you can walk into an ostomy support group meeting and not smell anything that is foul or offensive. And, for those with an ileostomy or colostomy who are concerned about odor when emptying their pouch, there are in-pouch deodorants that can be used to eliminate any waste odors that may exist.   Will you make noises?  Everyone produces gas, especially if they are an air-swallower. But intestinal sounds that occur from time to time are no differ-ent than a gurgling tummy, and quite often your clothing will muffle any sounds.   Will you feel the waste discharges?  For those with a colostomy or ileostomy there might be a slight pressure when waste leaves your body, but understand that the intestines have no nerve endings, so there will be no unpleasant sensations. Those with a urostomy will probably be unaware of any kidney drainage.   Will you be a captive of the toilet?  Immediately post-op you will spend more time in the bathroom than you will after your body recovers from  surgery. Every person is different, but on average those with an ileostomy or urostomy may empty their pouches 4 to 6 times a day; a little  less if you have a colostomy. The average wear time between pouch system changes is 3 to 5 days and the changing process should take less than 30 minutes.   Will I need to be on a special diet? Most people return to their normal diet when they have recovered from surgery. Be sure to chew your food well, eat a well-balanced diet and drink plenty of fluids. If you experience problems with a certain food, wait a couple of weeks and try it again.  Will there be odor and noises? Pouching systems are designed to be odor-proof or odor-resistant. There are deodorants that can be used in the pouch. Medications are also available to help reduce odor. Limit gas-producing foods and carbonated beverages. You will experience less gas and fewer noises as you heal from surgery.  How much time will it take to care for my ostomy? At first, you may spend a lot of time learning   about your ostomy and how to take care of it. As you become more comfortable and skilled at changing the pouching system, it will take very little time to care for it.   Will I be able to return to work? People with ostomies can perform most jobs. As soon as you have healed from surgery, you should be able to return to work. Heavy lifting (more than 10 pounds) may be discouraged.   What about intimacy? Sexual relationships and intimacy are important and fulfilling aspects of your life. They should continue after ostomy surgery. Intimacy-related concerns should be discussed openly between you and your partner.   Can I wear regular clothing? You do not need to wear special clothing. Ostomy pouches are fairly flat and barely noticeable. Elastic undergarments will not hurt the stoma or prevent the ostomy from functioning.   Can I participate in sports? An ostomy should not limit your involvement in sports.  Many people with ostomies are runners, skiers, swimmers or participate in other active lifestyles. Talk with your caregiver first before doing heavy physical activity.  Will you starve?  Not if you follow doctor's orders at each stage of your post-op adjustment. There is no such thing as an "ostomy diet". Some people with an ostomy will be able to eat and tolerate anything; others may find diffi-culty with some foods. Each person is an individual and must determine, by trial, what is best for them. A good practice for all is to drink plenty of water.   Will you be a social outcast?  Have you met anyone who has an ostomy and is a social outcast? Why should you be the first? Only your attitude and self image will effect how you are treated. No confi-dent person is an outcast.    PROFESSIONAL HELP   Resources are available if you need help or have questions about your ostomy.   Specially trained nurses called Wound, Ostomy Continence Nurses (WOCN) are available for consultation in most major medical centers.  Consider getting an ostomy consult at an outpatient ostomy clinic.   [place] has an Ostomy Clinic run by an WOCN ostomy nurse at the [facility].  [phone]. [facility] can help set up an appointment   The United Ostomy Association (UOA) is a group made up of many local chapters throughout the [place]. These local groups hold meetings and provide support to prospective and existing ostomates. They sponsor educational events and have qualified visitors to make personal or telephone visits. Contact the UOA for the chapter nearest you and for other educational publications.  More detailed information can be found in Colostomy Guide, a publication of the United Ostomy Association (UOA). Contact UOA at 1-800-826-0826 or visit their web site at www.uoaa.org. The website contains links to other sites, suppliers and resources.  [facility] Secure Start  Services: Start at the website to enlist for support.  Your Wound Ostomy (WOCN) nurse may have started this process. https://www.[employer].com/en/securestart Secure Start services are designed to support people as they live their lives with an ostomy or neurogenic bladder. Enrolling is easy and at no cost to the patient. We realize that each person's needs and life journey are different. Through Secure Start services, we want to help people live their life, their way.  #######################################################   Managing Your Pain After Surgery Without Opioids    Thank you for participating in our program to help patients manage their pain after surgery without opioids. This is part of   our effort to provide you with the best care possible, without exposing you or your family to the risk that opioids pose.  What pain can I expect after surgery? You can expect to have some pain after surgery. This is normal. The pain is typically worse the day after surgery, and quickly begins to get better. Many studies have found that many patients are able to manage their pain after surgery with Over-the-Counter (OTC) medications such as Tylenol and Motrin. If you have a condition that does not allow you to take Tylenol or Motrin, notify your surgical team.  How will I manage my pain? The best strategy for controlling your pain after surgery is around the clock pain control with Tylenol (acetaminophen) and Motrin (ibuprofen or Advil). Alternating these medications with each other allows you to maximize your pain control. In addition to Tylenol and Motrin, you can use heating pads or ice packs on your incisions to help reduce your pain.  How will I alternate your regular strength over-the-counter pain medication? You will take a dose of pain medication every three hours. Start by taking 650 mg of Tylenol (2 pills of 325 mg) 3 hours later take 600 mg of Motrin (3 pills of 200 mg) 3 hours after  taking the Motrin take 650 mg of Tylenol 3 hours after that take 600 mg of Motrin.   - 1 -  See example - if your first dose of Tylenol is at 12:00 PM   12:00 PM Tylenol 650 mg (2 pills of 325 mg)  3:00 PM Motrin 600 mg (3 pills of 200 mg)  6:00 PM Tylenol 650 mg (2 pills of 325 mg)  9:00 PM Motrin 600 mg (3 pills of 200 mg)  Continue alternating every 3 hours   We recommend that you follow this schedule around-the-clock for at least 3 days after surgery, or until you feel that it is no longer needed. Use the table on the last page of this handout to keep track of the medications you are taking. Important: Do not take more than 3000mg of Tylenol or 3200mg of Motrin in a 24-hour period. Do not take ibuprofen/Motrin if you have a history of bleeding stomach ulcers, severe kidney disease, &/or actively taking a blood thinner  What if I still have pain? If you have pain that is not controlled with the over-the-counter pain medications (Tylenol and Motrin or Advil) you might have what we call "breakthrough" pain. You will receive a prescription for a small amount of an opioid pain medication such as Oxycodone, Tramadol, or Tylenol with Codeine. Use these opioid pills in the first 24 hours after surgery if you have breakthrough pain. Do not take more than 1 pill every 4-6 hours.  If you still have uncontrolled pain after using all opioid pills, don't hesitate to call our staff using the number provided. We will help make sure you are managing your pain in the best way possible, and if necessary, we can provide a prescription for additional pain medication.   Day 1    Time  Name of Medication Number of pills taken  Amount of Acetaminophen  Pain Level   Comments  AM PM       AM PM       AM PM       AM PM       AM PM       AM PM       AM PM         AM PM       Total Daily amount of Acetaminophen Do not take more than  3,000 mg per day      Day 2    Time  Name of Medication  Number of pills taken  Amount of Acetaminophen  Pain Level   Comments  AM PM       AM PM       AM PM       AM PM       AM PM       AM PM       AM PM       AM PM       Total Daily amount of Acetaminophen Do not take more than  3,000 mg per day      Day 3    Time  Name of Medication Number of pills taken  Amount of Acetaminophen  Pain Level   Comments  AM PM       AM PM       AM PM       AM PM         AM PM       AM PM       AM PM       AM PM       Total Daily amount of Acetaminophen Do not take more than  3,000 mg per day      Day 4    Time  Name of Medication Number of pills taken  Amount of Acetaminophen  Pain Level   Comments  AM PM       AM PM       AM PM       AM PM       AM PM       AM PM       AM PM       AM PM       Total Daily amount of Acetaminophen Do not take more than  3,000 mg per day      Day 5    Time  Name of Medication Number of pills taken  Amount of Acetaminophen  Pain Level   Comments  AM PM       AM PM       AM PM       AM PM       AM PM       AM PM       AM PM       AM PM       Total Daily amount of Acetaminophen Do not take more than  3,000 mg per day      Day 6    Time  Name of Medication Number of pills taken  Amount of Acetaminophen  Pain Level  Comments  AM PM       AM PM       AM PM       AM PM       AM PM       AM PM       AM PM       AM PM       Total Daily amount of Acetaminophen Do not take more than  3,000 mg per day      Day 7    Time  Name of Medication Number of pills taken  Amount of Acetaminophen  Pain Level   Comments    AM PM       AM PM       AM PM       AM PM       AM PM       AM PM       AM PM       AM PM       Total Daily amount of Acetaminophen Do not take more than  3,000 mg per day        For additional information about how and where to safely dispose of unused opioid medications - https://www.morepowerfulnc.org  Disclaimer: This document contains  information and/or instructional materials adapted from [facility] for the typical patient with your condition. It does not replace medical advice from your health care provider because your experience may differ from that of the typical patient. Talk to your health care provider if you have any questions about this document, your condition or your treatment plan. Adapted from [facility]  

## 2023-08-14 NOTE — Plan of Care (Signed)
   Problem: Education: Goal: Knowledge of General Education information will improve Description: Including pain rating scale, medication(s)/side effects and non-pharmacologic comfort measures Outcome: Progressing   Problem: Activity: Goal: Risk for activity intolerance will decrease Outcome: Progressing

## 2023-08-15 LAB — CBC
HCT: 33.8 % — ABNORMAL LOW (ref 39.0–52.0)
Hemoglobin: 11.3 g/dL — ABNORMAL LOW (ref 13.0–17.0)
MCH: 31 pg (ref 26.0–34.0)
MCHC: 33.4 g/dL (ref 30.0–36.0)
MCV: 92.6 fL (ref 80.0–100.0)
Platelets: 284 10*3/uL (ref 150–400)
RBC: 3.65 MIL/uL — ABNORMAL LOW (ref 4.22–5.81)
RDW: 11.5 % (ref 11.5–15.5)
WBC: 6.1 10*3/uL (ref 4.0–10.5)
nRBC: 0 % (ref 0.0–0.2)

## 2023-08-15 NOTE — Progress Notes (Signed)
 Progress Note  4 Days Post-Op  Subjective: Tolerating soft diet without n/v. Passing flatus having BM. No complaints at present.   Spouse at bedside  Objective: Vital signs in last 24 hours: Temp:  [97.8 F (36.6 C)-98.9 F (37.2 C)] 97.8 F (36.6 C) (02/08 0623) Pulse Rate:  [76-80] 79 (02/08 0623) Resp:  [16-18] 18 (02/08 0623) BP: (135-159)/(83-92) 135/83 (02/08 0623) SpO2:  [97 %-100 %] 97 % (02/08 0623) Last BM Date : 08/14/23  Intake/Output from previous day: 02/07 0701 - 02/08 0700 In: 506.5 [P.O.:360; IV Piggyback:146.5] Out: 305 [Drains:305] Intake/Output this shift: No intake/output data recorded.  PE: General: pleasant, WD, male who is sitting up on bench in room.  Lungs: Respiratory effort nonlabored Abd: soft, mild distension - mild expected TTP over incision which is cdi with staples. Drain remains SS Skin: warm and dry Psych: A&Ox3 with an appropriate affect.    Lab Results:  Recent Labs    08/14/23 0454 08/15/23 0543  WBC 7.2 6.1  HGB 12.1* 11.3*  HCT 36.3* 33.8*  PLT 245 284   BMET No results for input(s): NA, K, CL, CO2, GLUCOSE, BUN, CREATININE, CALCIUM in the last 72 hours.  PT/INR No results for input(s): LABPROT, INR in the last 72 hours. CMP     Component Value Date/Time   NA 135 08/12/2023 0502   K 3.9 08/12/2023 0502   CL 98 08/12/2023 0502   CO2 25 08/12/2023 0502   GLUCOSE 139 (H) 08/12/2023 0502   BUN 12 08/12/2023 0502   CREATININE 0.74 08/12/2023 0502   CALCIUM 8.3 (L) 08/12/2023 0502   PROT 7.7 08/10/2023 1925   ALBUMIN 3.8 08/10/2023 1925   AST 31 08/10/2023 1925   ALT 37 08/10/2023 1925   ALKPHOS 73 08/10/2023 1925   BILITOT 1.3 (H) 08/10/2023 1925   GFRNONAA >60 08/12/2023 0502   GFRAA >60 03/11/2018 0810   Lipase     Component Value Date/Time   LIPASE 22 08/10/2023 1925       Studies/Results: No results found.   Anti-infectives: Anti-infectives (From admission, onward)     Start     Dose/Rate Route Frequency Ordered Stop   08/11/23 2200  piperacillin -tazobactam (ZOSYN ) IVPB 3.375 g        3.375 g 12.5 mL/hr over 240 Minutes Intravenous Every 8 hours 08/11/23 1600     08/11/23 1400  piperacillin -tazobactam (ZOSYN ) IVPB 3.375 g  Status:  Discontinued        3.375 g 12.5 mL/hr over 240 Minutes Intravenous Every 8 hours 08/11/23 0747 08/11/23 1554   08/11/23 0800  piperacillin -tazobactam (ZOSYN ) IVPB 3.375 g  Status:  Discontinued        3.375 g 12.5 mL/hr over 240 Minutes Intravenous Every 8 hours 08/11/23 0740 08/11/23 0747   08/11/23 0630  piperacillin -tazobactam (ZOSYN ) IVPB 3.375 g        3.375 g 100 mL/hr over 30 Minutes Intravenous  Once 08/11/23 0629 08/11/23 0736        Assessment/Plan  Intra abdominal abscess POD4 s/p ex lap with drainage of IAA Dr. Eletha 2/4  - tolerating sips of clears. Bm x2. Start fld and adat soft - afebrile, WBC nml. Continue zosyn  and monitor wbc and fever curve. Continue PO abx x1 wk at dc - JP SS - continue for now 305 ml/24h. Possibly out prior to dc if output coming down - encouraged ambulation and IS use  Possible discharge tomorrow - monitor drain output  FEN: soft, SLIV ID: [email or web address]   2/4> VTE: lovenox   HTN - cont to monitor. Recommend f/u with PCP   LOS: 4 days   Lonni Pizza, MD [facility], [facility]

## 2023-08-15 NOTE — Plan of Care (Signed)
  Problem: Activity: Goal: Risk for activity intolerance will decrease Outcome: Adequate for Discharge   Problem: Nutrition: Goal: Adequate nutrition will be maintained Outcome: Progressing   Problem: Coping: Goal: Level of anxiety will decrease Outcome: Adequate for Discharge   Problem: Elimination: Goal: Will not experience complications related to bowel motility Outcome: Completed/Met   Problem: Elimination: Goal: Will not experience complications related to urinary retention Outcome: Completed/Met

## 2023-08-15 NOTE — Plan of Care (Signed)

## 2023-08-16 MED ORDER — TRAMADOL HCL 50 MG PO TABS
50.0000 mg | ORAL_TABLET | Freq: Four times a day (QID) | ORAL | 0 refills | Status: AC | PRN
Start: 2023-08-16 — End: 2023-08-21

## 2023-08-16 MED ORDER — AMOXICILLIN-POT CLAVULANATE 875-125 MG PO TABS: 1.0000 | ORAL_TABLET | Freq: Two times a day (BID) | ORAL | 0 refills | Status: AC

## 2023-08-16 NOTE — Progress Notes (Signed)
 Assessment unchanged. Pt and wife verbalized understanding of dc instructions including medications to resume and follow up care. Drain discontinued as well as honeycomb dressing. Discharged to front entrance via wc accompanied by NT.

## 2023-08-16 NOTE — Plan of Care (Signed)
  Problem: Education: Goal: Knowledge of General Education information will improve Description: Including pain rating scale, medication(s)/side effects and non-pharmacologic comfort measures Outcome: Progressing   Problem: Clinical Measurements: Goal: Ability to maintain clinical measurements within normal limits will improve Outcome: Progressing   Problem: Nutrition: Goal: Adequate nutrition will be maintained Outcome: Progressing   Problem: Coping: Goal: Level of anxiety will decrease Outcome: Progressing   Problem: Pain Managment: Goal: General experience of comfort will improve and/or be controlled Outcome: Progressing   Problem: Health Behavior/Discharge Planning: Goal: Ability to manage health-related needs will improve Outcome: Progressing

## 2023-08-16 NOTE — Progress Notes (Signed)
 Progress Note  5 Days Post-Op  Subjective: Tolerating soft diet without n/v. Passing flatus having BM. No complaints at present.  Pain is well-controlled.  Spouse at bedside  Objective: Vital signs in last 24 hours: Temp:  [97.9 F (36.6 C)-98.3 F (36.8 C)] 98.1 F (36.7 C) (02/09 0538) Pulse Rate:  [74-82] 74 (02/09 0538) Resp:  [18-20] 20 (02/09 0538) BP: (140-156)/(89-96) 144/95 (02/09 0538) SpO2:  [96 %-99 %] 96 % (02/09 0538) Last BM Date : 08/15/23  Intake/Output from previous day: 02/08 0701 - 02/09 0700 In: 2055.7 [P.O.:1910; IV Piggyback:145.7] Out: 140 [Drains:140] Intake/Output this shift: No intake/output data recorded.  PE: General: pleasant, WD, male who is sitting up on bench in room.  Lungs: Respiratory effort nonlabored Abd: soft, mild distension - mild expected TTP over incision which is cdi with staples. Drain is now thin, serous-output is coming down nicely Skin: warm and dry Psych: A&Ox3 with an appropriate affect.    Lab Results:  Recent Labs    08/14/23 0454 08/15/23 0543  WBC 7.2 6.1  HGB 12.1* 11.3*  HCT 36.3* 33.8*  PLT 245 284   BMET No results for input(s): NA, K, CL, CO2, GLUCOSE, BUN, CREATININE, CALCIUM in the last 72 hours.  PT/INR No results for input(s): LABPROT, INR in the last 72 hours. CMP     Component Value Date/Time   NA 135 08/12/2023 0502   K 3.9 08/12/2023 0502   CL 98 08/12/2023 0502   CO2 25 08/12/2023 0502   GLUCOSE 139 (H) 08/12/2023 0502   BUN 12 08/12/2023 0502   CREATININE 0.74 08/12/2023 0502   CALCIUM 8.3 (L) 08/12/2023 0502   PROT 7.7 08/10/2023 1925   ALBUMIN 3.8 08/10/2023 1925   AST 31 08/10/2023 1925   ALT 37 08/10/2023 1925   ALKPHOS 73 08/10/2023 1925   BILITOT 1.3 (H) 08/10/2023 1925   GFRNONAA >60 08/12/2023 0502   GFRAA >60 03/11/2018 0810   Lipase     Component Value Date/Time   LIPASE 22 08/10/2023 1925       Studies/Results: No results  found.   Anti-infectives: Anti-infectives (From admission, onward)    Start     Dose/Rate Route Frequency Ordered Stop   08/16/23 0000  amoxicillin -clavulanate (AUGMENTIN ) 875-125 MG tablet        1 tablet Oral 2 times daily 08/16/23 0806 08/23/23 2359   08/11/23 2200  piperacillin -tazobactam (ZOSYN ) IVPB 3.375 g        3.375 g 12.5 mL/hr over 240 Minutes Intravenous Every 8 hours 08/11/23 1600     08/11/23 1400  piperacillin -tazobactam (ZOSYN ) IVPB 3.375 g  Status:  Discontinued        3.375 g 12.5 mL/hr over 240 Minutes Intravenous Every 8 hours 08/11/23 0747 08/11/23 1554   08/11/23 0800  piperacillin -tazobactam (ZOSYN ) IVPB 3.375 g  Status:  Discontinued        3.375 g 12.5 mL/hr over 240 Minutes Intravenous Every 8 hours 08/11/23 0740 08/11/23 0747   08/11/23 0630  piperacillin -tazobactam (ZOSYN ) IVPB 3.375 g        3.375 g 100 mL/hr over 30 Minutes Intravenous  Once 08/11/23 0629 08/11/23 0736        Assessment/Plan  Intra abdominal abscess POD5 s/p ex lap with drainage of IAA Dr. Eletha 2/4  - tolerating diet and having bowel fxn. Cont soft diet - afebrile, WBC nml.  Transition to PO antibiotics for 5 additional day course as per Dr. Eletha -JP output is now thin  serous and the output has dramatically dropped.  As discussed with Dr. Eletha, plan for removal prior to discharge today. - encouraged ambulation and IS use  Plan for discharge home today  FEN: soft, SLIV ID: zosyn  2/4> VTE: lovenox   HTN - cont to monitor. Recommend f/u with PCP  - He is comfortable with and highly motivated to go home today.  Surgically, stable for discharge.  Postoperative expectations been reviewed with he and his wife.  Things to watch out for were reviewed.  Follow-up in our office is being arranged.  He has been encouraged to call with any questions or concerns.   LOS: 5 days   Lonni Pizza, MD [facility], [facility]

## 2023-08-16 NOTE — Discharge Summary (Signed)
 Patient ID: Alexander [name] MRN: [id number] DOB/AGE: 54/02/1971 54 y.o.  Admit date: 08/11/2023 Discharge date: 08/16/2023  Discharge Diagnoses Patient Active Problem List   Diagnosis Date Noted   Intra-abdominal abscess (HCC) 08/12/2023   Anxiety 05/13/2013   GERD (gastroesophageal reflux disease) 04/11/2013   Dyslipidemia 12/24/2012   Family history of premature CAD 12/24/2012   Atypical chest pain 12/24/2012    Consultants None  Procedures OR 08/11/23: Exploratory laparotomy with drainage of abdominal abscess   Hospital Course: He was admitted postoperatively and recovered well.  His diet was gradually advanced which he tolerated.  He began having spontaneous return of bowel function.  His JP output was initially fairly high but serosanguineous in color.  The output came down nicely over the ensuing 2 days.  On 08/16/2023, he is tolerating a diet, ambulating well on his own, pain is well-controlled, and having spontaneous bowel function.  He and his wife are highly motivated to go home today.  Given the improvement in both quantity and now serous character of drain output, plan for removal prior to discharge.  Expectations have been reviewed.  Questions were answered.  Things to watch out for were reviewed.  Follow-up in our office has been arranged.    Allergies as of 08/16/2023   No Active Allergies      Medication List     TAKE these medications    amoxicillin -clavulanate 875-125 MG tablet Commonly known as: AUGMENTIN  Take 1 tablet by mouth 2 (two) times daily for 7 days.   aspirin 81 MG chewable tablet Chew 81 mg by mouth daily.   atorvastatin 40 MG tablet Commonly known as: LIPITOR Take 40 mg by mouth at bedtime.   CoQ10 200 MG Caps Take 200 mg by mouth daily.   Repatha  SureClick 140 MG/ML Soaj Generic drug: Evolocumab  ADMINISTER 1 ML UNDER THE SKIN EVERY 14 DAYS What changed: See the new instructions.   traMADol  50 MG tablet Commonly known as: Ultram  Take 1  tablet (50 mg total) by mouth every 6 (six) hours as needed for up to 5 days (postop pain not controlled with tylenol  and ibuprofen  first).          Follow-up Information     Eletha Boas, MD. Call.   Specialty: General Surgery Why: We are making a follow up appointment for you., Please call to confirm appointment time., Arrive 30 minutes early to complete check in, and bring photo ID and insurance card. Contact information: [address] [place] [address] [phone]         Surgery, [place]. Schedule an appointment as soon as possible for a visit on 08/25/2023.   Specialty: General Surgery Why: For suture removal Contact information: [address] [place] [address] [phone]                 Lonni HERO. Teresa, M.D. [facility]

## 2023-08-17 ENCOUNTER — Other Ambulatory Visit (HOSPITAL_COMMUNITY): Payer: Self-pay | Admitting: Surgery

## 2023-08-17 MED ORDER — METRONIDAZOLE 500 MG PO TABS: 500.0000 mg | ORAL_TABLET | Freq: Three times a day (TID) | ORAL | 0 refills | Status: AC

## 2023-08-17 MED ORDER — CIPROFLOXACIN HCL 750 MG PO TABS: 750.0000 mg | ORAL_TABLET | Freq: Two times a day (BID) | ORAL | 0 refills | Status: AC

## 2023-08-31 ENCOUNTER — Other Ambulatory Visit: Payer: Self-pay | Admitting: Internal Medicine

## 2023-09-21 ENCOUNTER — Other Ambulatory Visit (HOSPITAL_BASED_OUTPATIENT_CLINIC_OR_DEPARTMENT_OTHER): Payer: Self-pay | Admitting: Student

## 2023-09-21 DIAGNOSIS — K651 Peritoneal abscess: Secondary | ICD-10-CM

## 2023-09-21 DIAGNOSIS — Z9889 Other specified postprocedural states: Secondary | ICD-10-CM | POA: Diagnosis not present

## 2023-09-22 ENCOUNTER — Encounter (HOSPITAL_COMMUNITY): Payer: Self-pay

## 2023-09-22 ENCOUNTER — Encounter (HOSPITAL_BASED_OUTPATIENT_CLINIC_OR_DEPARTMENT_OTHER): Payer: Self-pay

## 2023-09-22 ENCOUNTER — Telehealth (HOSPITAL_BASED_OUTPATIENT_CLINIC_OR_DEPARTMENT_OTHER): Payer: Self-pay | Admitting: Internal Medicine

## 2023-09-22 ENCOUNTER — Other Ambulatory Visit: Payer: Self-pay | Admitting: Surgery

## 2023-09-22 ENCOUNTER — Inpatient Hospital Stay (HOSPITAL_COMMUNITY)
Admission: EM | Admit: 2023-09-22 | Discharge: 2023-09-25 | DRG: 391 | Disposition: A | Discharge status: Home / Self Care | Source: Ambulatory Visit | Attending: General Surgery | Admitting: General Surgery

## 2023-09-22 ENCOUNTER — Ambulatory Visit (HOSPITAL_COMMUNITY)
Admission: RE | Admit: 2023-09-22 | Discharge: 2023-09-22 | Disposition: A | Discharge status: Home / Self Care | Source: Ambulatory Visit | Attending: Student | Admitting: Student

## 2023-09-22 ENCOUNTER — Other Ambulatory Visit: Payer: Self-pay

## 2023-09-22 DIAGNOSIS — Z7982 Long term (current) use of aspirin: Secondary | ICD-10-CM

## 2023-09-22 DIAGNOSIS — K651 Peritoneal abscess: Secondary | ICD-10-CM | POA: Diagnosis not present

## 2023-09-22 DIAGNOSIS — E876 Hypokalemia: Secondary | ICD-10-CM | POA: Diagnosis not present

## 2023-09-22 DIAGNOSIS — Z79899 Other long term (current) drug therapy: Secondary | ICD-10-CM

## 2023-09-22 DIAGNOSIS — Z87891 Personal history of nicotine dependence: Secondary | ICD-10-CM | POA: Diagnosis not present

## 2023-09-22 DIAGNOSIS — K5720 Diverticulitis of large intestine with perforation and abscess without bleeding: Secondary | ICD-10-CM | POA: Diagnosis not present

## 2023-09-22 DIAGNOSIS — R61 Generalized hyperhidrosis: Secondary | ICD-10-CM | POA: Diagnosis not present

## 2023-09-22 DIAGNOSIS — E785 Hyperlipidemia, unspecified: Secondary | ICD-10-CM | POA: Diagnosis not present

## 2023-09-22 DIAGNOSIS — Z808 Family history of malignant neoplasm of other organs or systems: Secondary | ICD-10-CM | POA: Diagnosis not present

## 2023-09-22 DIAGNOSIS — K5730 Diverticulosis of large intestine without perforation or abscess without bleeding: Secondary | ICD-10-CM | POA: Diagnosis not present

## 2023-09-22 DIAGNOSIS — Z880 Allergy status to penicillin: Secondary | ICD-10-CM | POA: Diagnosis not present

## 2023-09-22 DIAGNOSIS — R109 Unspecified abdominal pain: Secondary | ICD-10-CM | POA: Diagnosis not present

## 2023-09-22 DIAGNOSIS — R188 Other ascites: Secondary | ICD-10-CM | POA: Diagnosis present

## 2023-09-22 DIAGNOSIS — Z8249 Family history of ischemic heart disease and other diseases of the circulatory system: Secondary | ICD-10-CM | POA: Diagnosis not present

## 2023-09-22 DIAGNOSIS — N400 Enlarged prostate without lower urinary tract symptoms: Secondary | ICD-10-CM | POA: Diagnosis not present

## 2023-09-22 DIAGNOSIS — T8143XA Infection following a procedure, organ and space surgical site, initial encounter: Principal | ICD-10-CM | POA: Diagnosis present

## 2023-09-22 MED ORDER — METHOCARBAMOL 1000 MG/10ML IJ SOLN
500.0000 mg | Freq: Four times a day (QID) | INTRAMUSCULAR | Status: DC | PRN
  Administered 2023-09-24 (×2): 500 mg via INTRAVENOUS
  Filled 2023-09-22 (×2): qty 10

## 2023-09-22 MED ORDER — PIPERACILLIN-TAZOBACTAM 3.375 G IVPB
3.3750 g | Freq: Three times a day (TID) | INTRAVENOUS | Status: DC
  Administered 2023-09-22 – 2023-09-25 (×9): 3.375 g via INTRAVENOUS
  Filled 2023-09-22 (×9): qty 50

## 2023-09-22 MED ORDER — OXYCODONE HCL 5 MG PO TABS: 5.0000 mg | ORAL_TABLET | ORAL | Status: DC | PRN

## 2023-09-22 MED ORDER — OXYCODONE HCL 5 MG PO TABS: 10.0000 mg | ORAL_TABLET | ORAL | Status: DC | PRN

## 2023-09-22 MED ORDER — SODIUM CHLORIDE (PF) 0.9 % IJ SOLN
INTRAMUSCULAR | Status: AC
  Filled 2023-09-22: qty 50

## 2023-09-22 MED ORDER — IOHEXOL 300 MG/ML  SOLN
100.0000 mL | Freq: Once | INTRAMUSCULAR | Status: AC | PRN
  Administered 2023-09-22: 100 mL via INTRAVENOUS

## 2023-09-22 MED ORDER — PROCHLORPERAZINE EDISYLATE 10 MG/2ML IJ SOLN: 10.0000 mg | INTRAMUSCULAR | Status: DC | PRN

## 2023-09-22 MED ORDER — IOHEXOL 9 MG/ML PO SOLN
ORAL | Status: AC
  Filled 2023-09-22: qty 1000

## 2023-09-22 MED ORDER — ACETAMINOPHEN 325 MG PO TABS
650.0000 mg | ORAL_TABLET | Freq: Four times a day (QID) | ORAL | Status: DC
  Administered 2023-09-22: 650 mg via ORAL
  Filled 2023-09-22 (×2): qty 2

## 2023-09-22 MED ORDER — LACTATED RINGERS IV SOLN: INTRAVENOUS | Status: AC

## 2023-09-22 MED ORDER — DOCUSATE SODIUM 100 MG PO CAPS
100.0000 mg | ORAL_CAPSULE | Freq: Two times a day (BID) | ORAL | Status: DC
  Administered 2023-09-23 – 2023-09-25 (×4): 100 mg via ORAL
  Filled 2023-09-22 (×6): qty 1

## 2023-09-22 MED ORDER — HYDROMORPHONE HCL 1 MG/ML IJ SOLN: 0.5000 mg | INTRAMUSCULAR | Status: DC | PRN

## 2023-09-22 MED ORDER — ENOXAPARIN SODIUM 40 MG/0.4ML IJ SOSY
40.0000 mg | PREFILLED_SYRINGE | INTRAMUSCULAR | Status: DC
  Administered 2023-09-22 – 2023-09-24 (×3): 40 mg via SUBCUTANEOUS
  Filled 2023-09-22 (×3): qty 0.4

## 2023-09-22 MED ORDER — IOHEXOL 9 MG/ML PO SOLN: 1000.0000 mL | ORAL | Status: AC

## 2023-09-22 MED ORDER — KETOROLAC TROMETHAMINE 15 MG/ML IJ SOLN
15.0000 mg | Freq: Three times a day (TID) | INTRAMUSCULAR | Status: DC
  Administered 2023-09-22 – 2023-09-25 (×8): 15 mg via INTRAVENOUS
  Filled 2023-09-22 (×9): qty 1

## 2023-09-22 MED ORDER — ONDANSETRON HCL 4 MG/2ML IJ SOLN: 4.0000 mg | Freq: Four times a day (QID) | INTRAMUSCULAR | Status: DC | PRN

## 2023-09-22 MED ORDER — SIMETHICONE 80 MG PO CHEW
80.0000 mg | CHEWABLE_TABLET | Freq: Four times a day (QID) | ORAL | Status: DC | PRN
  Administered 2023-09-24: 80 mg via ORAL
  Filled 2023-09-22 (×2): qty 1

## 2023-09-22 NOTE — H&P (Signed)
 Patient with recent surgery for intra-abdominal abscess.  Had been having some abdominal discomfort last week and was seen in the office Monday.  Follow-up CT scan ordered, but not completed until this evening.  WBC was elevated.  I was the surgeon on call and received the call report from the radiologist, although I am unfamiliar with the case.  I called the patient, who was at home eating dinner with his wife.  He stated that he felt better than he had in several days.  He was instructed to go to [facility] ED for direct admission to [facility].  We have called Bed Control and admission orders are "Signed and Held".    Start IV antibiotics tonight. Plan IR drain of pelvic abscess tomorrow.  Alexander [name]. Corliss Skains, MD, [facility]  General Surgery   09/22/2023 8:21 PM

## 2023-09-22 NOTE — Telephone Encounter (Signed)
 Pt c/o medication issue:  1. Name of Medication:   REPATHA SURECLICK 140 MG/ML SOAJ    2. How are you currently taking this medication (dosage and times per day)? As prescribed   3. Are you having a reaction (difficulty breathing--STAT)? No   4. What is your medication issue? Patient is requesting our office reach out to his pharmacy to have this prescription switched to auto refill so he does not have to continue calling each time for medication to be filled with them.   Please advise.

## 2023-09-22 NOTE — Progress Notes (Signed)
 Transported pt up to room 1306, via wheelchair, with wife at side. Pt is warm, dry, no visible distress. No c/o pain or discomfort, at present time.

## 2023-09-23 ENCOUNTER — Inpatient Hospital Stay (HOSPITAL_COMMUNITY)

## 2023-09-23 ENCOUNTER — Encounter (HOSPITAL_COMMUNITY): Payer: Self-pay

## 2023-09-23 LAB — BASIC METABOLIC PANEL
Anion gap: 11 (ref 5–15)
BUN: 14 mg/dL (ref 6–20)
CO2: 26 mmol/L (ref 22–32)
Calcium: 8.7 mg/dL — ABNORMAL LOW (ref 8.9–10.3)
Chloride: 100 mmol/L (ref 98–111)
Creatinine, Ser: 0.8 mg/dL (ref 0.61–1.24)
GFR, Estimated: >60 mL/min (ref >60)
Glucose, Bld: 119 mg/dL — ABNORMAL HIGH (ref 70–99)
Potassium: 3.9 mmol/L (ref 3.5–5.1)
Sodium: 137 mmol/L (ref 135–145)

## 2023-09-23 LAB — CBC
HCT: 34.7 % — ABNORMAL LOW (ref 39.0–52.0)
Hemoglobin: 11.5 g/dL — ABNORMAL LOW (ref 13.0–17.0)
MCH: 31.2 pg (ref 26.0–34.0)
MCHC: 33.1 g/dL (ref 30.0–36.0)
MCV: 94 fL (ref 80.0–100.0)
Platelets: 207 10*3/uL (ref 150–400)
RBC: 3.69 MIL/uL — ABNORMAL LOW (ref 4.22–5.81)
RDW: 12.4 % (ref 11.5–15.5)
WBC: 9.7 10*3/uL (ref 4.0–10.5)
nRBC: 0 % (ref 0.0–0.2)

## 2023-09-23 LAB — PROTIME-INR
INR: 1.1 (ref 0.8–1.2)
Prothrombin Time: 14 s (ref 11.4–15.2)

## 2023-09-23 LAB — HIV ANTIBODY (ROUTINE TESTING W REFLEX): HIV Screen 4th Generation wRfx: NONREACTIVE

## 2023-09-23 MED ORDER — OXYCODONE HCL 5 MG PO TABS
5.0000 mg | ORAL_TABLET | ORAL | Status: DC | PRN
  Administered 2023-09-24: 10 mg via ORAL
  Filled 2023-09-23: qty 2

## 2023-09-23 MED ORDER — FENTANYL CITRATE (PF) 100 MCG/2ML IJ SOLN
INTRAMUSCULAR | Status: AC
  Filled 2023-09-23: qty 2

## 2023-09-23 MED ORDER — MIDAZOLAM HCL 2 MG/2ML IJ SOLN
INTRAMUSCULAR | Status: AC | PRN
  Administered 2023-09-23: 2 mg via INTRAVENOUS
  Administered 2023-09-23 (×2): 1 mg via INTRAVENOUS

## 2023-09-23 MED ORDER — DIPHENHYDRAMINE HCL 50 MG/ML IJ SOLN
INTRAMUSCULAR | Status: AC | PRN
  Administered 2023-09-23: 25 mg via INTRAVENOUS

## 2023-09-23 MED ORDER — MIDAZOLAM HCL 2 MG/2ML IJ SOLN
INTRAMUSCULAR | Status: AC
  Filled 2023-09-23: qty 4

## 2023-09-23 MED ORDER — FENTANYL CITRATE (PF) 100 MCG/2ML IJ SOLN
INTRAMUSCULAR | Status: AC | PRN
Start: 2023-09-23 — End: 2023-09-23
  Administered 2023-09-23 (×2): 50 ug via INTRAVENOUS

## 2023-09-23 MED ORDER — SODIUM CHLORIDE 0.9% FLUSH
5.0000 mL | Freq: Three times a day (TID) | INTRAVENOUS | Status: DC
  Administered 2023-09-23 – 2023-09-25 (×5): 5 mL

## 2023-09-23 MED ORDER — ACETAMINOPHEN 500 MG PO TABS
1000.0000 mg | ORAL_TABLET | Freq: Four times a day (QID) | ORAL | Status: DC
  Administered 2023-09-23 – 2023-09-25 (×6): 1000 mg via ORAL
  Filled 2023-09-23 (×8): qty 2

## 2023-09-23 MED ORDER — DIPHENHYDRAMINE HCL 50 MG/ML IJ SOLN
INTRAMUSCULAR | Status: AC
  Filled 2023-09-23: qty 1

## 2023-09-23 NOTE — Consult Note (Addendum)
 Chief Complaint: Patient was seen in consultation today for Intra-abdominal abscess with consideration for drainage.  Referring Provider(s): Dr. Ivar Drape, MD  Supervising Physician: Roanna Banning  Patient Status: [facility] - In-pt  Patient is Full Code  History of Present Illness: Alexander [name] is a 53 y.o. male with PMHx notable for HLD.  Patient was recently inpatient with similar complaint. He presented to ED with several days of abdominal pain on 2/4, and was admitted with intra-abdominal abscess secondary to SBO with perforated diverticulum. He underwent exploratory laparoscopy with drain placement with Dr. Gerrit Friends on 2/4. Drain was removed on 2/9 and patient discharged.  Patient was seen in follow-up by General Surgery yesterday and underwent a CT A/P w/ contrast.  Stomach/Bowel: Mild-to-moderate free intraperitoneal air is seen, consistent with bowel perforation. Increased wall thickening is seen involving the sigmoid colon and small bowel loops in the left lower quadrant, with adjacent mesenteric inflammatory changes. Sigmoid diverticulosis is seen in this region. A multiloculated fluid and gas collection is also seen in the left lower quadrant mesentery measuring 7.9 x 4.1 cm, also increased in size, consistent with abscess. This is most likely due to perforated diverticulitis, with primary small bowel process considered less likely.  IMPRESSION: Mild-to-moderate free intraperitoneal air, consistent with bowel perforation.   Increased wall thickening involving the sigmoid colon and small bowel loops in the left lower quadrant, with adjacent mesenteric inflammatory changes and abscess. This is felt most likely due to perforated sigmoid diverticulitis, with primary small bowel process considered less likely.   No evidence of bowel obstruction.  Patient was urgently direct-admitted by General Surgery to [facility].  Interventional Radiology was  requested for intra-abdominal fluid collection drainage. Request was reviewed and approved by Dr. Milford Cage. Patient is tentatively scheduled for same in IR today.  All labs and medications are within acceptable parameters. No pertinent allergies. Patient has been NPO since midnight. Lovenox was held this AM.   Currently without any significant complaints. He does experience mild to moderate abdominal pain at LLQ. Patient alert and laying in bed,calm. Denies any fevers, headache, chest pain, SOB, cough, abdominal pain, nausea, vomiting or bleeding.     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(HCC)    Hyperlipidemia     Past Surgical History:  Procedure Laterality Date   deviated septal surgery  07/08/1995   HERNIA REPAIR  07/07/1997   LAPAROTOMY N/A 08/11/2023   Procedure: EXPLORATORY LAPAROTOMY, DRAINAGE OF PELVIC ABSCESS;  Surgeon: Darnell Level, MD;  Location: [facility] ORS;  Service: General;  Laterality: N/A;    Allergies: Penicillins  Medications: Prior to Admission medications   Medication Sig Start Date End Date Taking? Authorizing Provider  aspirin 81 MG chewable tablet Chew 81 mg by mouth daily.    [provider]  atorvastatin (LIPITOR) 40 MG tablet Take 40 mg by mouth at bedtime. 04/08/13   [provider]  Coenzyme Q10 (COQ10) 200 MG CAPS Take 200 mg by mouth daily.    [provider]  REPATHA SURECLICK 140 MG/ML SOAJ ADMINISTER 1 ML UNDER THE SKIN EVERY 14 DAYS 09/01/23   Hilty, Lisette Abu, MD     Family History  Problem Relation Age of Onset   Sudden death Father        sudeen cardiac death in 28s   Cancer Mother  abdominal ca   Heart attack Maternal Grandfather     Social History   Socioeconomic History   Marital status: Married    Spouse name: Not on file   Number of children: 0   Years of education: Not on file   Highest education level: Not  on file  Occupational History   Occupation: business owner    Employer: [employer]  Tobacco Use   Smoking status: Never   Smokeless tobacco: Former    Types: Snuff   Tobacco comments:    dip - 25 years ago  Substance and Sexual Activity   Alcohol use: No    Alcohol/week: 0.0 standard drinks of alcohol   Drug use: No   Sexual activity: Not on file  Other Topics Concern   Not on file  Social History Narrative   Not on file   Social Drivers of Health   Financial Resource Strain: Not on file  Food Insecurity: No Food Insecurity (09/22/2023)   Hunger Vital Sign    Worried About Running Out of Food in the Last Year: Never true    Ran Out of Food in the Last Year: Never true  Transportation Needs: No Transportation Needs (09/22/2023)   PRAPARE - Administrator, Civil Service (Medical): No    Lack of Transportation (Non-Medical): No  Physical Activity: Not on file  Stress: Not on file  Social Connections: Socially Integrated (08/11/2023)   Social Connection and Isolation Panel [NHANES]    Frequency of Communication with Friends and Family: More than three times a week    Frequency of Social Gatherings with Friends and Family: More than three times a week    Attends Religious Services: More than 4 times per year    Active Member of [employer] or Organizations: Yes    Attends Engineer, structural: More than 4 times per year    Marital Status: Married     Review of Systems: A 12 point ROS discussed and pertinent positives are indicated in the HPI above.  All other systems are negative.  Review of Systems  Constitutional:  Negative for activity change, chills, fatigue, fever and unexpected weight change.  Respiratory:  Negative for cough, chest tightness, shortness of breath and wheezing.   Cardiovascular:  Negative for chest pain.  Gastrointestinal:  Positive for abdominal pain. Negative for nausea.  Skin:  Negative for color change.  Psychiatric/Behavioral:   Negative for behavioral problems and confusion.     Vital Signs: BP (!) 143/93 (BP Location: Left Arm)   Pulse 70   Temp 98.4 F (36.9 C) (Oral)   Resp 18   SpO2 99%   Advance Care Plan: The advanced care place/surrogate decision maker was discussed at the time of visit and the patient did not wish to discuss or was not able to name a surrogate decision maker or provide an advance care plan.  Physical Exam Vitals reviewed.  Constitutional:      General: He is not in acute distress.    Appearance: Normal appearance.  HENT:     Mouth/Throat:     Mouth: Mucous membranes are dry.  Cardiovascular:     Rate and Rhythm: Normal rate and regular rhythm.     Pulses: Normal pulses.     Heart sounds: No murmur heard. Pulmonary:     Effort: Pulmonary effort is normal. No respiratory distress.     Breath sounds: Normal breath sounds.  Abdominal:     General: Abdomen is flat.  Tenderness: There is abdominal tenderness.     Comments: Mild to moderate LLQ tenderness to palpation.  Musculoskeletal:        General: Normal range of motion.     Cervical back: Normal range of motion.  Skin:    General: Skin is warm and dry.  Neurological:     Mental Status: He is alert and oriented to person, place, and time.  Psychiatric:        Mood and Affect: Mood normal.        Behavior: Behavior normal.        Thought Content: Thought content normal.        Judgment: Judgment normal.     Imaging: CT ABDOMEN PELVIS W CONTRAST Result Date: 09/22/2023 CLINICAL DATA:  Abdominal pain.  Follow-up peritoneal abscess. EXAM: CT ABDOMEN AND PELVIS WITH CONTRAST TECHNIQUE: Multidetector CT imaging of the abdomen and pelvis was performed using the standard protocol following bolus administration of intravenous contrast. RADIATION DOSE REDUCTION: This exam was performed according to the departmental dose-optimization program which includes automated exposure control, adjustment of the mA and/or kV according to  patient size and/or use of iterative reconstruction technique. CONTRAST:  OMNIPAQUE IOHEXOL 300 MG/ML  SOLN COMPARISON:  08/11/2023 FINDINGS: Lower Chest: No acute findings. Hepatobiliary: No suspicious hepatic masses identified. Gallbladder is empty. No No evidence of biliary ductal dilatation. Pancreas:  No mass or inflammatory changes. Spleen: Within normal limits in size and appearance. Adrenals/Urinary Tract: No suspicious masses identified. No evidence of ureteral calculi or hydronephrosis. Stomach/Bowel: Mild-to-moderate free intraperitoneal air is seen, consistent with bowel perforation. Increased wall thickening is seen involving the sigmoid colon and small bowel loops in the left lower quadrant, with adjacent mesenteric inflammatory changes. Sigmoid diverticulosis is seen in this region. A multiloculated fluid and gas collection is also seen in the left lower quadrant mesentery measuring 7.9 x 4.1 cm, also increased in size, consistent with abscess. This is most likely due to perforated diverticulitis, with primary small bowel process considered less likely. Vascular/Lymphatic: No pathologically enlarged lymph nodes. No acute vascular findings. Reproductive:  Stable mildly enlarged prostate. Other:  None. Musculoskeletal:  No suspicious bone lesions identified. IMPRESSION: Mild-to-moderate free intraperitoneal air, consistent with bowel perforation. Increased wall thickening involving the sigmoid colon and small bowel loops in the left lower quadrant, with adjacent mesenteric inflammatory changes and abscess. This is felt most likely due to perforated sigmoid diverticulitis, with primary small bowel process considered less likely. No evidence of bowel obstruction. Critical Value/emergent results were called by telephone at the time of interpretation on 09/22/2023 at 7:10 pm to provider Dr. Harlon Flor, who verbally acknowledged these results. Electronically Signed   By: Danae Orleans M.D.   On: 09/22/2023  19:15    Labs:  CBC: Recent Labs    08/12/23 0502 08/14/23 0454 08/15/23 0543 09/23/23 0024  WBC 11.1* 7.2 6.1 9.7  HGB 12.8* 12.1* 11.3* 11.5*  HCT 38.1* 36.3* 33.8* 34.7*  PLT 230 245 284 207    COAGS: Recent Labs    09/23/23 0742  INR 1.1    BMP: Recent Labs    08/10/23 1925 08/12/23 0502 09/23/23 0024  NA 136 135 137  K 3.8 3.9 3.9  CL 96* 98 100  CO2 26 25 26   GLUCOSE 115* 139* 119*  BUN 16 12 14   CALCIUM 9.1 8.3* 8.7*  CREATININE 0.93 0.74 0.80  GFRNONAA >60 >60 >60    LIVER FUNCTION TESTS: Recent Labs    08/10/23 1925  BILITOT 1.3*  AST 31  ALT 37  ALKPHOS 73  PROT 7.7  ALBUMIN 3.8    TUMOR MARKERS: No results for input(s): "AFPTM", "CEA", "CA199", "CHROMGRNA" in the last 8760 hours.  Assessment and Plan:  Patient was recently inpatient with similar complaint. He presented to ED with several days of abdominal pain on 2/4, and was admitted with intra-abdominal abscess secondary to SBO with perforated diverticulum. He underwent exploratory laparoscopy with drain placement with Dr. Gerrit Friends on 2/4. Drain was removed on 2/9 and patient discharged.  Patient was seen in follow-up by General Surgery yesterday and underwent a CT A/P w/ contrast, with was notable for free intraperitoneal air consistent with bowel perforation a multiloculated fluid and gas collection in the left lower quadrant mesentery measuring 7.9 x 4.1 cm, also increased in size, consistent with abscess.  Patient was urgently direct-admitted by General Surgery to [facility].  Patient is tentatively scheduled for intra-abdominal fluid collection drainage in IR today.  Risks and benefits discussed with the patient including bleeding, infection, damage to adjacent structures, bowel perforation/fistula connection, and sepsis.  All of the patient's questions were answered, patient is agreeable to proceed.  Consent signed and in chart.    Thank you for allowing our  service to participate in Alexander [name] 's care.  Electronically Signed: Sable Feil, PA-C   09/23/2023, 9:05 AM      I spent a total of 40 Minutes in face to face in clinical consultation, greater than 50% of which was counseling/coordinating care for intra-abdominal fluid collection with consideration for drainage.

## 2023-09-23 NOTE — Procedures (Signed)
 Vascular and Interventional Radiology Procedure Note  Patient: Alexander [name] DOB: 1970/09/10 Medical Record Number: [id number] Note Date/Time: 09/23/23 4:29 PM   Performing Physician: Roanna Banning, MD Assistant(s): None  Diagnosis: LLQ abscess  Procedure: DRAINAGE CATHETER PLACEMENT into a LEFT LOWER QUADRANT ABSCESS  Anesthesia: Conscious Sedation Complications: None Estimated Blood Loss: Minimal Specimens: Sent for Gram Stain, Aerobe Culture, and Anerobe Culture  Findings:  Successful CT-guided placement of 12 F catheter into LLQ abscess.  Plan:  - Flush drain with 5 mL Normal Saline every 8 hours. - Follow up drain evaluation / sinogram in 7-10 day(s).  See detailed procedure note with images in PACS. The patient tolerated the procedure well without incident or complication and was returned to Recovery in stable condition.    Roanna Banning, MD [facility] Radiology   Pager. [phone] Clinic. [phone]

## 2023-09-23 NOTE — Discharge Instructions (Addendum)
 Interventional Radiology Percutaneous Abscess Drain Placement After Care   This sheet gives you information about how to care for yourself after your procedure. Your health care provider may also give you more specific instructions. Your drain was placed by an interventional radiologist with [facility] Radiology. If you have questions or concerns, contact [facility] Radiology at [phone]  What is a percutaneous drain?   A drain is a small plastic tube (catheter) that goes into the fluid collection in your body through your skin.   How long will I need the drain?   How long the drain needs to stay in is determined by where the drain is, how much comes out of the drain each day and if you are having any other surgical procedures.   Interventional radiology will determine when it is time to remove the drain. It is important to follow up as directed so that the drain can be removed as soon as it is safe to do so.   What can I expect after the procedure?   After the procedure, it is common to have:   A small amount of bruising and discomfort in the area where the drainage tube (catheter) was placed.   Sleepiness and fatigue. This should go away after the medicines you were given have worn off.   Follow these instructions at home:   Insertion site care   Check your insertion site when you change the bandage. Check for:   More redness, swelling, or pain.   More fluid or blood.   Warmth.   Pus or a bad smell.   When caring for your insertion site:   Wash your hands with soap and water for at least 20 seconds before and after you change your bandage (dressing). If soap and water are not available, use hand sanitizer.   You do not need to change your dressing everyday if it is clean and dry. Change your dressing every 3 days or as needed when it is soiled, wet or becoming dislodged. You will need to change your dressing each time you shower.   Leave stitches (sutures), skin glue,  or adhesive strips in place. These skin closures may need to stay in place for 2 weeks or longer. If adhesive strip edges start to loosen and curl up, you may trim the loose edges. Do not remove adhesive strips completely unless your health care provider tells you to do so.   Catheter care   Flush the catheter once per day with 5 mL of 0.9% normal saline unless you are told otherwise by your healthcare provider. This helps to prevent clogs in the catheter.   To disconnect the drain, turn the clear plastic tube to the left. Attach the saline syringe by placing it on the white end of the drain and turning gently to the right. Once attached gently push the plunger to the 5 mL mark. After you are done flushing, disconnect the syringe by turning to the left and reattach your drainage container   If you have a bulb please be sure the bulb is charged after reconnecting it - to do this pinch the bulb between your thumb and first finger and close the stopper located on the top of the bulb.    Check for fluid leaking from around your catheter (instead of fluid draining through your catheter). This may be a sign that the drain is no longer working correctly.   Write down the following information every time you empty your bag:  The date and time.   The amount of drainage.   Activity   Rest at home for 1-2 days after your procedure.   For the first 48 hours do not lift anything more than 10 lbs (about a gallon of milk). You may perform moderate activities/exercise. Please avoid strenuous activities during this time.   Avoid any activities which may pull on your drain as this can cause your drain to become dislodged.   If you were given a sedative during the procedure, it can affect you for several hours. Do not drive or operate machinery until your health care provider says that it is safe.   General instructions   For mild pain take over-the-counter medications as needed for pain such as Tylenol  or Advil. If you are experiencing severe pain please call our office as this may indicate an issue with your drain.    If you were prescribed an antibiotic medicine, take it as told by your health care provider. Do not stop using the antibiotic even if you start to feel better.   You may shower 24 hours after the drain is placed. To do this cover the insertion site with a water tight material such as saran wrap and seal the edges with tape, you may also purchase waterproof dressings at your local drug store. Shower as usual and then remove the water tight dressing and any gauze/tape underneath it once you have exited the shower and dried off. Allow the area to air dry or pat dry with a clean towel. Once the skin is completely dry place a new gauze dressing. It is important to keep the site dry at all times to prevent infection.   Do not submerge the drain - this means you cannot take baths, swim, use a hot tub, etc. until the drain is removed.    Do not use any products that contain nicotine or tobacco, such as cigarettes, e-cigarettes, and chewing tobacco. If you need help quitting, ask your health care provider.   Keep all follow-up visits as told by your health care provider. This is important.   Contact a health care provider if:   You have less than 10 mL of drainage a day for 2-3 days in a row, or as directed by your health care provider.   You have any of these signs of infection:   More redness, swelling, or pain around your incision area.   More fluid or blood coming from your incision area.   Warmth coming from your incision area.   Pus or a bad smell coming from your incision area.   You have fluid leaking from around your catheter (instead of through your catheter).   You are unable to flush the drain.   You have a fever or chills.   You have pain that does not get better with medicine.   You have not been contacted to schedule a drain follow up appointment within 10  days of discharge from the hospital.   Please call [facility] Radiology at [phone] with any questions or concerns.   Get help right away if:   Your catheter comes out.   You suddenly stop having drainage from your catheter.   You suddenly have blood in the fluid that is draining from your catheter.   You become dizzy or you faint.   You develop a rash.   You have nausea or vomiting.   You have difficulty breathing or you feel short of breath.  You develop chest pain.   You have problems with your speech or vision.   You have trouble balancing or moving your arms or legs.   Summary   It is common to have a small amount of bruising and discomfort in the area where the drainage tube (catheter) was placed. You may also have minor discomfort with movement while the drain is in place.   Flush the drain once per day with 5 mL of 0.9% normal saline (unless you were told otherwise by your healthcare provider).    Record the amount of drainage from the bag every time you empty it.   Change the dressing every 3 days or earlier if soiled/wet. Keep the skin dry under the dressing.   You may shower with the drain in place. Do not submerge the drain (no baths, swimming, hot tubs, etc.).   Contact  Radiology at [phone] if you have more redness, swelling, or pain around your incision area or if you have pain that does not get better with medicine.   This information is not intended to replace advice given to you by your health care provider. Make sure you discuss any questions you have with your health care provider.   Document Revised: 09/26/2021 Document Reviewed: 06/18/2019   Elsevier Patient Education  2023 [employer]         Interventional Radiology Drain Record   Empty your drain at least once per day. You may empty it as often as needed. Use this form to write down the amount of fluid that has collected in the drainage container. Bring this form  with you to your follow-up visits. Please call [facility] Radiology at [phone] with any questions or concerns prior to your appointment.   Drain #1 location: ___________________   Date __________ Time __________ Amount __________   Date __________ Time __________ Amount __________   Date __________ Time __________ Amount __________   Date __________ Time __________ Amount __________   Date __________ Time __________ Amount __________   Date __________ Time __________ Amount __________   Date __________ Time __________ Amount __________   Date __________ Time __________ Amount __________   Date __________ Time __________ Amount __________   Date __________ Time __________ Amount __________   Date __________ Time __________ Amount __________   Date __________ Time __________ Amount __________   Date __________ Time __________ Amount __________   Date __________ Time __________ Amount __________

## 2023-09-23 NOTE — H&P (Signed)
 Subjective: CC: Known to our service.Hx of Exploratory laparotomy with drainage of abdominal abscess by Dr. Gerrit Friends on 08/11/23. Drain was removed prior to discharge on POD 5.  Reports he was doing well postoperatively until Friday, 3/14, when he started developing LLQ abdominal pain.  Symptoms progressed and worsened on Sunday 3/16 with associated with fever and diaphoresis. No n/v. He was seen in our office on 3/17 and had labs and a CT A/P ordered.  WBC on 3/17 noted to be 17.6.  CT A/P yesterday showed  IMPRESSION: - Mild-to-moderate free intraperitoneal air, consistent with bowel perforation. - Increased wall thickening involving the sigmoid colon and small bowel loops in the left lower quadrant, with adjacent mesenteric inflammatory changes and abscess. This is felt most likely due to perforated sigmoid diverticulitis, with primary small bowel process considered less likely. - No evidence of bowel obstruction.  Patient was direct admitted to the hospital.  He reports he was actually feeling better yesterday with improvement of his LLQ pain and resolution of fever and diaphoresis. He was eating regular food at home without issues.  Reports his last BM was yesterday and liquidy but nonbloody.  He is still passing flatus. He has not required any as needed pain medications since admission.  Patient is currently afebrile without tachycardia or hypotension.  WBC 9.7 today.  He reports he has never had a colonoscopy. He is scheduled to see Dr. Ewing Schlein to arrange this next Tuesday. No person or fmhx of IBD or colon cancer.   His wife is at bedside.   Objective: Vital signs in last 24 hours: Temp:  [97.8 F (36.6 C)-98.4 F (36.9 C)] 98.4 F (36.9 C) (03/19 0600) Pulse Rate:  [70-85] 70 (03/19 0600) Resp:  [18] 18 (03/19 0600) BP: (117-161)/(79-108) 143/93 (03/19 0600) SpO2:  [98 %-99 %] 99 % (03/19 0600) Last BM Date : 09/22/23  Intake/Output from previous day: 03/18 0701 -  03/19 0700 In: 238.1 [I.V.:182.3; IV Piggyback:55.8] Out: 400 [Urine:400] Intake/Output this shift: No intake/output data recorded.  PE: Gen:  Alert, NAD, pleasant Card:  Reg Pulm:  CTAB, no W/R/R, effort normal Abd: Soft, mild distension, llq abdominal ttp without rigidity or guarding. Otherwise NT. +BS. Midline wound well healed.  Ext:  No LE edema  Psych: A&Ox3   Lab Results:  Recent Labs    09/23/23 0024  WBC 9.7  HGB 11.5*  HCT 34.7*  PLT 207   BMET Recent Labs    09/23/23 0024  NA 137  K 3.9  CL 100  CO2 26  GLUCOSE 119*  BUN 14  CREATININE 0.80  CALCIUM 8.7*   PT/INR Recent Labs    09/23/23 0742  LABPROT 14.0  INR 1.1   CMP     Component Value Date/Time   NA 137 09/23/2023 0024   K 3.9 09/23/2023 0024   CL 100 09/23/2023 0024   CO2 26 09/23/2023 0024   GLUCOSE 119 (H) 09/23/2023 0024   BUN 14 09/23/2023 0024   CREATININE 0.80 09/23/2023 0024   CALCIUM 8.7 (L) 09/23/2023 0024   PROT 7.7 08/10/2023 1925   ALBUMIN 3.8 08/10/2023 1925   AST 31 08/10/2023 1925   ALT 37 08/10/2023 1925   ALKPHOS 73 08/10/2023 1925   BILITOT 1.3 (H) 08/10/2023 1925   GFRNONAA >60 09/23/2023 0024   GFRAA >60 03/11/2018 0810   Lipase     Component Value Date/Time   LIPASE 22 08/10/2023 1925    Studies/Results: CT  ABDOMEN PELVIS W CONTRAST Result Date: 09/22/2023 CLINICAL DATA:  Abdominal pain.  Follow-up peritoneal abscess. EXAM: CT ABDOMEN AND PELVIS WITH CONTRAST TECHNIQUE: Multidetector CT imaging of the abdomen and pelvis was performed using the standard protocol following bolus administration of intravenous contrast. RADIATION DOSE REDUCTION: This exam was performed according to the departmental dose-optimization program which includes automated exposure control, adjustment of the mA and/or kV according to patient size and/or use of iterative reconstruction technique. CONTRAST:  OMNIPAQUE IOHEXOL 300 MG/ML  SOLN COMPARISON:  08/11/2023 FINDINGS: Lower  Chest: No acute findings. Hepatobiliary: No suspicious hepatic masses identified. Gallbladder is empty. No No evidence of biliary ductal dilatation. Pancreas:  No mass or inflammatory changes. Spleen: Within normal limits in size and appearance. Adrenals/Urinary Tract: No suspicious masses identified. No evidence of ureteral calculi or hydronephrosis. Stomach/Bowel: Mild-to-moderate free intraperitoneal air is seen, consistent with bowel perforation. Increased wall thickening is seen involving the sigmoid colon and small bowel loops in the left lower quadrant, with adjacent mesenteric inflammatory changes. Sigmoid diverticulosis is seen in this region. A multiloculated fluid and gas collection is also seen in the left lower quadrant mesentery measuring 7.9 x 4.1 cm, also increased in size, consistent with abscess. This is most likely due to perforated diverticulitis, with primary small bowel process considered less likely. Vascular/Lymphatic: No pathologically enlarged lymph nodes. No acute vascular findings. Reproductive:  Stable mildly enlarged prostate. Other:  None. Musculoskeletal:  No suspicious bone lesions identified. IMPRESSION: Mild-to-moderate free intraperitoneal air, consistent with bowel perforation. Increased wall thickening involving the sigmoid colon and small bowel loops in the left lower quadrant, with adjacent mesenteric inflammatory changes and abscess. This is felt most likely due to perforated sigmoid diverticulitis, with primary small bowel process considered less likely. No evidence of bowel obstruction. Critical Value/emergent results were called by telephone at the time of interpretation on 09/22/2023 at 7:10 pm to provider Dr. Harlon Flor, who verbally acknowledged these results. Electronically Signed   By: Danae Orleans M.D.   On: 09/22/2023 19:15    Anti-infectives: Anti-infectives (From admission, onward)    Start     Dose/Rate Route Frequency Ordered Stop   09/22/23 2315   piperacillin-tazobactam (ZOSYN) IVPB 3.375 g        3.375 g 12.5 mL/hr over 240 Minutes Intravenous Every 8 hours 09/22/23 2218 09/29/23 2159        Assessment/Plan Hx of Exploratory laparotomy with drainage of abdominal abscess by Dr. Gerrit Friends on 08/11/23 - CT 3/18 w/ mild to mod free air and wall thickening of sigmoid colon and SB loops in LLQ along w/ adjacent mesenteric inflammatory changes and a 7.9cm x 4.1cm fluid collection c/f IAA.  - Patient is afebrile without tachycardia or hypotension, non-toxic appearing, no peritonitis on exam, wbc wnl. Will trial non-operative management  - Cont IV abx - IR consult for drainage. Asked RN to hold Lovenox. PT-INR written for  - Hopefully patient will improve with conservative treatment.  If patient fails to improve they may require repeating imaging, additional drain placement, or surgical intervention resulting in a exploratory laparotomy, bowel resection and possible ostomy. If patient improves with conservative management, he would need f/u with IR for repeat imaging, f/u with GI (supposed to see Dr. Ewing Schlein 3/25) for a colonoscopy and possible discussions of elective surgery for bowel resection w/ anastomosis if etiology is identified/confirmed on w/u outpatient (small bowel vs colon). This was discussed with the patient and his wife at bedside.   FEN - NPO for IR procedure,  IVF  VTE - SCDs, Lovenox  ID - Zosyn  I reviewed nursing notes, last 24 h vitals and pain scores, last 48 h intake and output, last 24 h labs and trends, and last 24 h imaging results.   LOS: 1 day    Jacinto Halim, [facility] 09/23/2023, 9:01 AM Please see Amion for pager number during day hours 7:00am-4:30pm

## 2023-09-23 NOTE — Progress Notes (Signed)
   09/23/23 1259  TOC Brief Assessment  Insurance and Status Reviewed  Patient has primary care physician Yes  Home environment has been reviewed home with wife  Prior level of function: independent  Social Drivers of Health Review SDOH reviewed no interventions necessary  Readmission risk has been reviewed Yes  Transition of care needs no transition of care needs at this time

## 2023-09-24 LAB — CBC
HCT: 33 % — ABNORMAL LOW (ref 39.0–52.0)
Hemoglobin: 10.8 g/dL — ABNORMAL LOW (ref 13.0–17.0)
MCH: 30.9 pg (ref 26.0–34.0)
MCHC: 32.7 g/dL (ref 30.0–36.0)
MCV: 94.6 fL (ref 80.0–100.0)
Platelets: 201 10*3/uL (ref 150–400)
RBC: 3.49 MIL/uL — ABNORMAL LOW (ref 4.22–5.81)
RDW: 12.3 % (ref 11.5–15.5)
WBC: 4 10*3/uL (ref 4.0–10.5)
nRBC: 0 % (ref 0.0–0.2)

## 2023-09-24 LAB — BASIC METABOLIC PANEL
Anion gap: 9 (ref 5–15)
BUN: 14 mg/dL (ref 6–20)
CO2: 26 mmol/L (ref 22–32)
Calcium: 8.4 mg/dL — ABNORMAL LOW (ref 8.9–10.3)
Chloride: 101 mmol/L (ref 98–111)
Creatinine, Ser: 0.88 mg/dL (ref 0.61–1.24)
GFR, Estimated: >60 mL/min (ref >60)
Glucose, Bld: 101 mg/dL — ABNORMAL HIGH (ref 70–99)
Potassium: 3.3 mmol/L — ABNORMAL LOW (ref 3.5–5.1)
Sodium: 136 mmol/L (ref 135–145)

## 2023-09-24 MED ORDER — POTASSIUM CHLORIDE CRYS ER 20 MEQ PO TBCR
40.0000 meq | EXTENDED_RELEASE_TABLET | Freq: Once | ORAL | Status: AC
  Administered 2023-09-24: 40 meq via ORAL
  Filled 2023-09-24: qty 2

## 2023-09-24 NOTE — Progress Notes (Signed)
 Referring Physician(s): Tsuei,Matthew  Supervising Physician: Ruel Favors  Patient Status:  [facility] - In-pt  Chief Complaint: Abdominal pain/abscess   Subjective: Patient feeling better since abdominal drain placed yesterday.  Still has some discomfort at drain insertion site.  Denies fever, nausea, vomiting   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(HCC)    Hyperlipidemia    Past Surgical History:  Procedure Laterality Date   deviated septal surgery  07/08/1995   HERNIA REPAIR  07/07/1997   LAPAROTOMY N/A 08/11/2023   Procedure: EXPLORATORY LAPAROTOMY, DRAINAGE OF PELVIC ABSCESS;  Surgeon: Darnell Level, MD;  Location: [facility] ORS;  Service: General;  Laterality: N/A;      Allergies: Penicillins  Medications: Prior to Admission medications   Medication Sig Start Date End Date Taking? Authorizing Provider  Aspirin 81 MG CAPS Take 81 mg by mouth daily.   Yes [provider]  atorvastatin (LIPITOR) 40 MG tablet Take 40 mg by mouth at bedtime. 04/08/13  Yes [provider]  ciprofloxacin (CIPRO) 500 MG tablet Take 500 mg by mouth 2 (two) times daily. 09/22/23  Yes [provider]  Coenzyme Q10 (COQ10) 200 MG CAPS Take 200 mg by mouth daily.   Yes [provider]  metroNIDAZOLE (FLAGYL) 500 MG tablet Take 500 mg by mouth 3 (three) times daily. 09/22/23  Yes [provider]  REPATHA SURECLICK 140 MG/ML SOAJ ADMINISTER 1 ML UNDER THE SKIN EVERY 14 DAYS Patient taking differently: Inject 140 mg into the muscle every 14 (fourteen) days. 09/01/23  Yes Hilty, Lisette Abu, MD     Vital Signs: BP (!) 134/93 (BP Location: Right Arm)   Pulse 75   Temp 98.2 F (36.8 C) (Oral)   Resp 18   Ht 6\' 2"  (1.88 m)   Wt 186 lb (84.4 kg)   SpO2 100%   BMI 23.88 kg/m   Physical Exam patient awake, alert.  Left lower quadrant  abd drain intact, insertion site  okay, mildly tender to palpation, small amount of turbid reddish-beige fluid in drain bag along with air; drain flushed earlier by nursing  Imaging: CT GUIDED PERITONEAL/RETROPERITONEAL FLUID DRAIN BY PERC CATH Result Date: 09/23/2023 INDICATION: [id number] Hx of abdominal abscess [id number] EXAM: CT-GUIDED DRAINAGE CATHETER PLACEMENT INTO A LEFT LOWER QUADRANT ABSCESS COMPARISON:  CT AP, 09/22/2023 MEDICATIONS: The patient is currently admitted to the hospital and receiving intravenous antibiotics. The antibiotics were administered within an appropriate time frame prior to the initiation of the procedure. Benadryl 25 mg IV ANESTHESIA/SEDATION: Moderate (conscious) sedation was employed during this procedure. A total of Versed 4 mg and Fentanyl 100 mcg was administered intravenously. Moderate Sedation Time: 22 minutes. The patient's level of consciousness and vital signs were monitored continuously by radiology nursing throughout the procedure under my direct supervision. CONTRAST:  None FLUOROSCOPY TIME:  CT dose; 1217 mGycm COMPLICATIONS: None immediate. PROCEDURE: RADIATION DOSE REDUCTION: This exam was performed according to the departmental dose-optimization program which includes automated exposure control, adjustment of the mA and/or kV according to patient size and/or use of iterative reconstruction technique. Informed written consent was obtained from the patient after a discussion of the risks, benefits and alternatives to treatment. The patient was placed supine on the CT gantry and a pre procedural CT was performed re-demonstrating the known abscess/fluid collection within the LEFT lower quadrant. The procedure was planned. A timeout was performed  prior to the initiation of the procedure. The LEFT was prepped and draped in the usual sterile fashion. The overlying soft tissues were anesthetized with 1% lidocaine with epinephrine. Appropriate trajectory was planned with the use of a 22 gauge spinal needle. An  18 gauge trocar needle was advanced into the abscess/fluid collection and a short Amplatz super stiff wire was coiled within the collection. Appropriate positioning was confirmed with a limited CT scan. The tract was serially dilated allowing placement of a 12 Fr drainage catheter. Appropriate positioning was confirmed with a limited postprocedural CT scan. 5 mL of purulent fluid was aspirated. The tube was connected to a drainage bag and sutured in place. A dressing was placed. The patient tolerated the procedure well without immediate post procedural complication. IMPRESSION: Successful CT-guided placement of a 12 Fr drainage catheter into the LEFT lower quadrant abscess with aspiration of 5 mL of purulent fluid. Samples were sent to the laboratory as requested by the ordering clinical team. RECOMMENDATIONS: The patient will return to Vascular Interventional Radiology (VIR) for routine drainage catheter evaluation and exchange in 7-10 days. Roanna Banning, MD [facility] Electronically Signed   By: Roanna Banning M.D.   On: 09/23/2023 17:47   CT ABDOMEN PELVIS W CONTRAST Result Date: 09/22/2023 CLINICAL DATA:  Abdominal pain.  Follow-up peritoneal abscess. EXAM: CT ABDOMEN AND PELVIS WITH CONTRAST TECHNIQUE: Multidetector CT imaging of the abdomen and pelvis was performed using the standard protocol following bolus administration of intravenous contrast. RADIATION DOSE REDUCTION: This exam was performed according to the departmental dose-optimization program which includes automated exposure control, adjustment of the mA and/or kV according to patient size and/or use of iterative reconstruction technique. CONTRAST:  OMNIPAQUE IOHEXOL 300 MG/ML  SOLN COMPARISON:  08/11/2023 FINDINGS: Lower Chest: No acute findings. Hepatobiliary: No suspicious hepatic masses identified. Gallbladder is empty. No No evidence of biliary ductal dilatation. Pancreas:  No  mass or inflammatory changes. Spleen: Within normal limits in size and appearance. Adrenals/Urinary Tract: No suspicious masses identified. No evidence of ureteral calculi or hydronephrosis. Stomach/Bowel: Mild-to-moderate free intraperitoneal air is seen, consistent with bowel perforation. Increased wall thickening is seen involving the sigmoid colon and small bowel loops in the left lower quadrant, with adjacent mesenteric inflammatory changes. Sigmoid diverticulosis is seen in this region. A multiloculated fluid and gas collection is also seen in the left lower quadrant mesentery measuring 7.9 x 4.1 cm, also increased in size, consistent with abscess. This is most likely due to perforated diverticulitis, with primary small bowel process considered less likely. Vascular/Lymphatic: No pathologically enlarged lymph nodes. No acute vascular findings. Reproductive:  Stable mildly enlarged prostate. Other:  None. Musculoskeletal:  No suspicious bone lesions identified. IMPRESSION: Mild-to-moderate free intraperitoneal air, consistent with bowel perforation. Increased wall thickening involving the sigmoid colon and small bowel loops in the left lower quadrant, with adjacent mesenteric inflammatory changes and abscess. This is felt most likely due to perforated sigmoid diverticulitis, with primary small bowel process considered less likely. No evidence of bowel obstruction. Critical Value/emergent results were called by telephone at the time of interpretation on 09/22/2023 at 7:10 pm to provider Dr. Harlon Flor, who verbally acknowledged these results. Electronically Signed   By: Danae Orleans M.D.   On: 09/22/2023 19:15    Labs:  CBC: Recent Labs    08/14/23 0454 08/15/23 0543 09/23/23 0024 09/24/23 0505  WBC 7.2 6.1 9.7 4.0  HGB 12.1* 11.3* 11.5* 10.8*  HCT 36.3* 33.8* 34.7* 33.0*  PLT 245 284 207 201    COAGS: Recent Labs    09/23/23 0742  INR 1.1    BMP: Recent Labs    08/10/23 1925 08/12/23 0502  09/23/23 0024 09/24/23 0505  NA 136 135 137 136  K 3.8 3.9 3.9 3.3*  CL 96* 98 100 101  CO2 26 25 26 26   GLUCOSE 115* 139* 119* 101*  BUN 16 12 14 14   CALCIUM 9.1 8.3* 8.7* 8.4*  CREATININE 0.93 0.74 0.80 0.88  GFRNONAA >60 >60 >60 >60    LIVER FUNCTION TESTS: Recent Labs    08/10/23 1925  BILITOT 1.3*  AST 31  ALT 37  ALKPHOS 73  PROT 7.7  ALBUMIN 3.8    Assessment and Plan: Hx of Exploratory laparotomy with drainage of abdominal abscess by Dr. Gerrit Friends on 08/11/23 Sigmoid diverticulitis with perforation with abscess versus small bowel diverticulitis with perforation and abscess ; s/p LLQ drain 3/19; afebrile, WBC nl, hgb 10.8(11.5), creat nl, drain fl cx pend  Drain Location: LLQ Size: Fr size: 12 Fr Date of placement: 09/23/23  Currently to: Drain collection device: gravity 24 hour output:  Output by Drain (mL) 09/22/23 0701 - 09/22/23 1900 09/22/23 1901 - 09/23/23 0700 09/23/23 0701 - 09/23/23 1900 09/23/23 1901 - 09/24/23 0700 09/24/23 0701 - 09/24/23 1437  Patient has no LDAs of requested type attached.      Current examination: Flushes/aspirates easily.  Insertion site unremarkable. Suture and stat lock in place. Dressed appropriately.   Plan: Continue TID flushes with 5 cc NS. Record output Q shift. Dressing changes QD or PRN if soiled.  Call IR APP or on call IR MD if difficulty flushing or sudden change in drain output.  Repeat imaging/possible drain injection once output < 10 mL/QD (excluding flush material). Consideration for drain removal if output is < 10 mL/QD (excluding flush material), pending discussion with the providing surgical service.  Discharge planning: Please contact IR APP or on call IR MD prior to patient d/c to ensure appropriate follow up plans are in place. Typically patient will follow up with IR clinic 10-14 days post d/c for repeat imaging/possible drain injection. IR scheduler will contact patient with date/time of appointment.  Patient will need to flush drain QD with 5 cc NS, record output QD, dressing changes every 2-3 days or earlier if soiled.   IR will continue to follow - please call with questions or concerns.      Electronically Signed: D. Jeananne Rama, PA-C 09/24/2023, 2:33 PM   I spent a total of 15 Minutes at the the patient's bedside AND on the patient's hospital floor or unit, greater than 50% of which was counseling/coordinating care for left lower abdominal abscess drain    Patient ID: Alexander [name], male   DOB: July 09, 1970, 53 y.o.   MRN: [id number]

## 2023-09-24 NOTE — Progress Notes (Signed)
 Instructed pt and his wife on care of his drain, gave actual demonstration and questions answered. They verbalized understanding.

## 2023-09-24 NOTE — Progress Notes (Signed)
 Subjective: CC: S/p IR drain placement yesterday w/ 5cc purulent fluid aspirated.   Patient had increased llq pain last night. Difficult to discern if 2/2 drain as pain was worse at times w/ movement and drain is close to where he presented w/ pain. Improved this am and well controlled. Had 1/2 patty melt, fries and a milkshake last night for dinner w/o increased abdominal pain, n/v. Passing flatus. No BM. Voiding. Mobilizing.    Patient is currently afebrile without tachycardia or hypotension.  WBC 4. K 3.3. Cx pending.   His wife is at bedside.   Objective: Vital signs in last 24 hours: Temp:  [97.5 F (36.4 C)-98.6 F (37 C)] 97.5 F (36.4 C) (03/20 9604) Pulse Rate:  [70-87] 81 (03/20 0608) Resp:  [14-20] 18 (03/20 0608) BP: (131-141)/(73-101) 132/91 (03/20 0608) SpO2:  [97 %-100 %] 97 % (03/20 0608) Weight:  [84.4 kg] 84.4 kg (03/19 1535) Last BM Date : 09/22/23  Intake/Output from previous day: 03/19 0701 - 03/20 0700 In: 1294.3 [P.O.:840; I.V.:310; IV Piggyback:144.3] Out: 545 [Urine:525] Intake/Output this shift: No intake/output data recorded.  PE: Gen:  Alert, NAD, pleasant Pulm:  Rate and effort normal Abd: Soft, mild distension, llq abdominal ttp without rigidity or guarding. Otherwise NT. +BS. Midline wound well healed. IR drain w/ bloody purulent drainage in gravity bag.  Psych: A&Ox3   Lab Results:  Recent Labs    09/23/23 0024 09/24/23 0505  WBC 9.7 4.0  HGB 11.5* 10.8*  HCT 34.7* 33.0*  PLT 207 201   BMET Recent Labs    09/23/23 0024 09/24/23 0505  NA 137 136  K 3.9 3.3*  CL 100 101  CO2 26 26  GLUCOSE 119* 101*  BUN 14 14  CREATININE 0.80 0.88  CALCIUM 8.7* 8.4*   PT/INR Recent Labs    09/23/23 0742  LABPROT 14.0  INR 1.1   CMP     Component Value Date/Time   NA 136 09/24/2023 0505   K 3.3 (L) 09/24/2023 0505   CL 101 09/24/2023 0505   CO2 26 09/24/2023 0505   GLUCOSE 101 (H) 09/24/2023 0505   BUN 14 09/24/2023  0505   CREATININE 0.88 09/24/2023 0505   CALCIUM 8.4 (L) 09/24/2023 0505   PROT 7.7 08/10/2023 1925   ALBUMIN 3.8 08/10/2023 1925   AST 31 08/10/2023 1925   ALT 37 08/10/2023 1925   ALKPHOS 73 08/10/2023 1925   BILITOT 1.3 (H) 08/10/2023 1925   GFRNONAA >60 09/24/2023 0505   GFRAA >60 03/11/2018 0810   Lipase     Component Value Date/Time   LIPASE 22 08/10/2023 1925    Studies/Results: CT GUIDED PERITONEAL/RETROPERITONEAL FLUID DRAIN BY PERC CATH Result Date: 09/23/2023 INDICATION: [id number] Hx of abdominal abscess [id number] EXAM: CT-GUIDED DRAINAGE CATHETER PLACEMENT INTO A LEFT LOWER QUADRANT ABSCESS COMPARISON:  CT AP, 09/22/2023 MEDICATIONS: The patient is currently admitted to the hospital and receiving intravenous antibiotics. The antibiotics were administered within an appropriate time frame prior to the initiation of the procedure. Benadryl 25 mg IV ANESTHESIA/SEDATION: Moderate (conscious) sedation was employed during this procedure. A total of Versed 4 mg and Fentanyl 100 mcg was administered intravenously. Moderate Sedation Time: 22 minutes. The patient's level of consciousness and vital signs were monitored continuously by radiology nursing throughout the procedure under my direct supervision. CONTRAST:  None FLUOROSCOPY TIME:  CT dose; 1217 mGycm COMPLICATIONS: None immediate. PROCEDURE: RADIATION DOSE REDUCTION: This exam was performed according to the departmental dose-optimization program  which includes automated exposure control, adjustment of the mA and/or kV according to patient size and/or use of iterative reconstruction technique. Informed written consent was obtained from the patient after a discussion of the risks, benefits and alternatives to treatment. The patient was placed supine on the CT gantry and a pre procedural CT was performed re-demonstrating the known abscess/fluid collection within the LEFT lower quadrant. The procedure was planned. A timeout was performed prior to  the initiation of the procedure. The LEFT was prepped and draped in the usual sterile fashion. The overlying soft tissues were anesthetized with 1% lidocaine with epinephrine. Appropriate trajectory was planned with the use of a 22 gauge spinal needle. An 18 gauge trocar needle was advanced into the abscess/fluid collection and a short Amplatz super stiff wire was coiled within the collection. Appropriate positioning was confirmed with a limited CT scan. The tract was serially dilated allowing placement of a 12 Fr drainage catheter. Appropriate positioning was confirmed with a limited postprocedural CT scan. 5 mL of purulent fluid was aspirated. The tube was connected to a drainage bag and sutured in place. A dressing was placed. The patient tolerated the procedure well without immediate post procedural complication. IMPRESSION: Successful CT-guided placement of a 12 Fr drainage catheter into the LEFT lower quadrant abscess with aspiration of 5 mL of purulent fluid. Samples were sent to the laboratory as requested by the ordering clinical team. RECOMMENDATIONS: The patient will return to Vascular Interventional Radiology (VIR) for routine drainage catheter evaluation and exchange in 7-10 days. Roanna Banning, MD [facility] Electronically Signed   By: Roanna Banning M.D.   On: 09/23/2023 17:47   CT ABDOMEN PELVIS W CONTRAST Result Date: 09/22/2023 CLINICAL DATA:  Abdominal pain.  Follow-up peritoneal abscess. EXAM: CT ABDOMEN AND PELVIS WITH CONTRAST TECHNIQUE: Multidetector CT imaging of the abdomen and pelvis was performed using the standard protocol following bolus administration of intravenous contrast. RADIATION DOSE REDUCTION: This exam was performed according to the departmental dose-optimization program which includes automated exposure control, adjustment of the mA and/or kV according to patient size and/or use of iterative reconstruction technique.  CONTRAST:  OMNIPAQUE IOHEXOL 300 MG/ML  SOLN COMPARISON:  08/11/2023 FINDINGS: Lower Chest: No acute findings. Hepatobiliary: No suspicious hepatic masses identified. Gallbladder is empty. No No evidence of biliary ductal dilatation. Pancreas:  No mass or inflammatory changes. Spleen: Within normal limits in size and appearance. Adrenals/Urinary Tract: No suspicious masses identified. No evidence of ureteral calculi or hydronephrosis. Stomach/Bowel: Mild-to-moderate free intraperitoneal air is seen, consistent with bowel perforation. Increased wall thickening is seen involving the sigmoid colon and small bowel loops in the left lower quadrant, with adjacent mesenteric inflammatory changes. Sigmoid diverticulosis is seen in this region. A multiloculated fluid and gas collection is also seen in the left lower quadrant mesentery measuring 7.9 x 4.1 cm, also increased in size, consistent with abscess. This is most likely due to perforated diverticulitis, with primary small bowel process considered less likely. Vascular/Lymphatic: No pathologically enlarged lymph nodes. No acute vascular findings. Reproductive:  Stable mildly enlarged prostate. Other:  None. Musculoskeletal:  No suspicious bone lesions identified. IMPRESSION: Mild-to-moderate free intraperitoneal air, consistent with bowel perforation. Increased wall thickening involving the sigmoid colon and small bowel loops in the left lower quadrant, with adjacent mesenteric inflammatory changes and abscess. This is felt most likely due to perforated sigmoid diverticulitis, with primary small bowel process considered less likely. No evidence of bowel obstruction. Critical Value/emergent results were  called by telephone at the time of interpretation on 09/22/2023 at 7:10 pm to provider Dr. Harlon Flor, who verbally acknowledged these results. Electronically Signed   By: Danae Orleans M.D.   On: 09/22/2023 19:15    Anti-infectives: Anti-infectives (From admission,  onward)    Start     Dose/Rate Route Frequency Ordered Stop   09/22/23 2315  piperacillin-tazobactam (ZOSYN) IVPB 3.375 g        3.375 g 12.5 mL/hr over 240 Minutes Intravenous Every 8 hours 09/22/23 2218 09/29/23 2159        Assessment/Plan Hx of Exploratory laparotomy with drainage of abdominal abscess by Dr. Gerrit Friends on 08/11/23 Sigmoid diverticulitis with perforation with abscess versus small bowel diverticulitis with perforation and abscess  - CT 3/18 w/ mild to mod free air and wall thickening of sigmoid colon and SB loops in LLQ along w/ adjacent mesenteric inflammatory changes and a 7.9cm x 4.1cm fluid collection c/f IAA.  - Patient is afebrile without tachycardia or hypotension, non-toxic appearing, no peritonitis on exam, wbc wnl. Cont trial non-operative management  - S/p IR drain 3/19. Drain per IR.  - Cont IV abx. IR drain cx pending.  - Hopefully patient will improve with conservative treatment.  If patient fails to improve they may require repeating imaging, additional drain placement, or surgical intervention resulting in a exploratory laparotomy, bowel resection and possible ostomy. If patient improves with conservative management, he would need f/u with IR for repeat imaging +/- drain study, f/u with GI (supposed to see Dr. Ewing Schlein 3/25) for a colonoscopy (in 6-8 weeks) and possible discussions of elective surgery for bowel resection w/ anastomosis if etiology is identified/confirmed on w/u outpatient (small bowel vs colon). This was discussed with the patient and his wife at bedside.   FEN - Change to soft diet - told him to take this slow. SLIV. Replace hypokalemia (K 3.3)  VTE - SCDs, Lovenox  ID - Zosyn  I reviewed nursing notes, last 24 h vitals and pain scores, last 48 h intake and output, last 24 h labs and trends, and last 24 h imaging results.   LOS: 2 days    Jacinto Halim, [facility] 09/24/2023, 9:06 AM Please see Amion for pager number  during day hours 7:00am-4:30pm

## 2023-09-24 NOTE — Plan of Care (Signed)

## 2023-09-25 ENCOUNTER — Encounter (HOSPITAL_COMMUNITY): Payer: Self-pay

## 2023-09-25 ENCOUNTER — Other Ambulatory Visit (HOSPITAL_COMMUNITY): Payer: Self-pay

## 2023-09-25 MED ORDER — METHOCARBAMOL 500 MG PO TABS
500.0000 mg | ORAL_TABLET | Freq: Three times a day (TID) | ORAL | 0 refills | Status: DC | PRN
  Filled 2023-09-25: qty 30, 10d supply, fill #0

## 2023-09-25 MED ORDER — ENSURE ENLIVE PO LIQD: 237.0000 mL | Freq: Two times a day (BID) | ORAL | Status: DC

## 2023-09-25 MED ORDER — ENSURE ENLIVE PO LIQD
237.0000 mL | Freq: Two times a day (BID) | ORAL | Status: DC
  Administered 2023-09-25 (×2): 237 mL via ORAL

## 2023-09-25 MED ORDER — ENSURE MAX PROTEIN PO LIQD
11.0000 [oz_av] | Freq: Every day | ORAL | Status: DC
  Administered 2023-09-25: 11 [oz_av] via ORAL

## 2023-09-25 MED ORDER — CIPROFLOXACIN HCL 500 MG PO TABS
500.0000 mg | ORAL_TABLET | Freq: Two times a day (BID) | ORAL | 0 refills | Status: AC
Start: 2023-09-25 — End: 2023-10-05
  Filled 2023-09-25: qty 20, 10d supply, fill #0

## 2023-09-25 MED ORDER — DOCUSATE SODIUM 100 MG PO CAPS: 100.0000 mg | ORAL_CAPSULE | Freq: Two times a day (BID) | ORAL | Status: AC | PRN

## 2023-09-25 MED ORDER — SODIUM CHLORIDE 0.9% FLUSH: 10.0000 mL | Freq: Every day | INTRAVENOUS | Status: DC

## 2023-09-25 MED ORDER — NORMAL SALINE FLUSH 0.9 % IV SOLN
INTRAVENOUS | 3 refills | Status: DC
  Filled 2023-09-25: qty 300, 30d supply, fill #0

## 2023-09-25 MED ORDER — OXYCODONE HCL 5 MG PO TABS
5.0000 mg | ORAL_TABLET | Freq: Four times a day (QID) | ORAL | 0 refills | Status: DC | PRN
  Filled 2023-09-25: qty 10, 3d supply, fill #0

## 2023-09-25 MED ORDER — METRONIDAZOLE 500 MG PO TABS
500.0000 mg | ORAL_TABLET | Freq: Three times a day (TID) | ORAL | 0 refills | Status: AC
  Filled 2023-09-25: qty 30, 10d supply, fill #0

## 2023-09-25 MED ORDER — ACETAMINOPHEN 500 MG PO TABS: 1000.0000 mg | ORAL_TABLET | Freq: Three times a day (TID) | ORAL | Status: AC | PRN

## 2023-09-25 MED ORDER — ENSURE MAX PROTEIN PO LIQD
11.0000 [oz_av] | Freq: Every day | ORAL | Status: AC
Start: 2023-09-26 — End: ?

## 2023-09-25 NOTE — Progress Notes (Signed)
 Chief Complaint: Patient was seen today for abdominal abscess   Referring Physician(s): Tsuei,Matthew  Supervising Physician: Gilmer Mor  Patient Status: [facility] - In-pt  Subjective: 09/25/23: Patient reports he is doing well. Patient denies pain at the drain site. He has been tolerating his diet. Patient notes ambulating frequently.   Objective: Physical Exam: BP (!) 150/96 (BP Location: Right Arm)   Pulse 77   Temp 98 F (36.7 C) (Oral)   Resp 20   Ht 6\' 2"  (1.88 m)   Wt 186 lb (84.4 kg)   SpO2 100%   BMI 23.88 kg/m   Constitutional: Adult male, alert and oriented, INAD. Skin: warm and dry. LLQ drain in place. No surrounding erythema or edema at the drain site. Pink/serous drainage present in gravity bag. No TTP over drain site. Chest: symmetric rise and fall of chest wall. Abdominal: Soft.  Neuro: alert and oriented. Psych: Adequate insight and judgement.    Current Facility-Administered Medications:    acetaminophen (TYLENOL) tablet 1,000 mg, 1,000 mg, Oral, Q6H, Maczis, Elmer Sow, PA-C, 1,000 mg at 09/25/23 8413   docusate sodium (COLACE) capsule 100 mg, 100 mg, Oral, BID, Stechschulte, Hyman Hopes, MD, 100 mg at 09/25/23 0843   enoxaparin (LOVENOX) injection 40 mg, 40 mg, Subcutaneous, Q24H, Stechschulte, Hyman Hopes, MD, 40 mg at 09/24/23 2243   feeding supplement (ENSURE ENLIVE / ENSURE PLUS) liquid 237 mL, 237 mL, Oral, BID BM, Maczis, Elmer Sow, PA-C, 237 mL at 09/25/23 1226   HYDROmorphone (DILAUDID) injection 0.5 mg, 0.5 mg, Intravenous, Q3H PRN, Stechschulte, Hyman Hopes, MD   ketorolac (TORADOL) 15 MG/ML injection 15 mg, 15 mg, Intravenous, Q8H, Stechschulte, Hyman Hopes, MD, 15 mg at 09/25/23 2440   methocarbamol (ROBAXIN) injection 500 mg, 500 mg, Intravenous, Q6H PRN, Stechschulte, Hyman Hopes, MD, 500 mg at 09/24/23 2044   ondansetron (ZOFRAN) injection 4 mg, 4 mg, Intravenous, Q6H PRN, Stechschulte, Hyman Hopes, MD   oxyCODONE (Oxy IR/ROXICODONE) immediate release tablet 5-10 mg,  5-10 mg, Oral, Q4H PRN, Jacinto Halim, PA-C, 10 mg at 09/24/23 0413   piperacillin-tazobactam (ZOSYN) IVPB 3.375 g, 3.375 g, Intravenous, Q8H, Stechschulte, Hyman Hopes, MD, Last Rate: 12.5 mL/hr at 09/25/23 1416, 3.375 g at 09/25/23 1416   prochlorperazine (COMPAZINE) injection 10 mg, 10 mg, Intravenous, Q4H PRN, Stechschulte, Hyman Hopes, MD   protein supplement (ENSURE MAX) liquid, 11 oz, Oral, Daily, Maczis, Elmer Sow, PA-C, 11 oz at 09/25/23 1027   simethicone (MYLICON) chewable tablet 80 mg, 80 mg, Oral, QID PRN, Stechschulte, Hyman Hopes, MD, 80 mg at 09/24/23 2044   sodium chloride flush (NS) 0.9 % injection 5 mL, 5 mL, Intracatheter, Q8H, Mugweru, Jon, MD, 5 mL at 09/25/23 1227  Labs: CBC Recent Labs    09/23/23 0024 09/24/23 0505  WBC 9.7 4.0  HGB 11.5* 10.8*  HCT 34.7* 33.0*  PLT 207 201   BMET Recent Labs    09/23/23 0024 09/24/23 0505  NA 137 136  K 3.9 3.3*  CL 100 101  CO2 26 26  GLUCOSE 119* 101*  BUN 14 14  CREATININE 0.80 0.88  CALCIUM 8.7* 8.4*   LFT No results for input(s): "PROT", "ALBUMIN", "AST", "ALT", "ALKPHOS", "BILITOT", "BILIDIR", "IBILI", "LIPASE" in the last 72 hours. PT/INR Recent Labs    09/23/23 0742  LABPROT 14.0  INR 1.1     Studies/Results: CT GUIDED PERITONEAL/RETROPERITONEAL FLUID DRAIN BY PERC CATH Result Date: 09/23/2023 INDICATION: [id number] Hx of abdominal abscess [id number] EXAM: CT-GUIDED DRAINAGE CATHETER PLACEMENT INTO A  LEFT LOWER QUADRANT ABSCESS COMPARISON:  CT AP, 09/22/2023 MEDICATIONS: The patient is currently admitted to the hospital and receiving intravenous antibiotics. The antibiotics were administered within an appropriate time frame prior to the initiation of the procedure. Benadryl 25 mg IV ANESTHESIA/SEDATION: Moderate (conscious) sedation was employed during this procedure. A total of Versed 4 mg and Fentanyl 100 mcg was administered intravenously. Moderate Sedation Time: 22 minutes. The patient's level of consciousness and vital  signs were monitored continuously by radiology nursing throughout the procedure under my direct supervision. CONTRAST:  None FLUOROSCOPY TIME:  CT dose; 1217 mGycm COMPLICATIONS: None immediate. PROCEDURE: RADIATION DOSE REDUCTION: This exam was performed according to the departmental dose-optimization program which includes automated exposure control, adjustment of the mA and/or kV according to patient size and/or use of iterative reconstruction technique. Informed written consent was obtained from the patient after a discussion of the risks, benefits and alternatives to treatment. The patient was placed supine on the CT gantry and a pre procedural CT was performed re-demonstrating the known abscess/fluid collection within the LEFT lower quadrant. The procedure was planned. A timeout was performed prior to the initiation of the procedure. The LEFT was prepped and draped in the usual sterile fashion. The overlying soft tissues were anesthetized with 1% lidocaine with epinephrine. Appropriate trajectory was planned with the use of a 22 gauge spinal needle. An 18 gauge trocar needle was advanced into the abscess/fluid collection and a short Amplatz super stiff wire was coiled within the collection. Appropriate positioning was confirmed with a limited CT scan. The tract was serially dilated allowing placement of a 12 Fr drainage catheter. Appropriate positioning was confirmed with a limited postprocedural CT scan. 5 mL of purulent fluid was aspirated. The tube was connected to a drainage bag and sutured in place. A dressing was placed. The patient tolerated the procedure well without immediate post procedural complication. IMPRESSION: Successful CT-guided placement of a 12 Fr drainage catheter into the LEFT lower quadrant abscess with aspiration of 5 mL of purulent fluid. Samples were sent to the laboratory as requested by the ordering clinical team. RECOMMENDATIONS: The patient will return to Vascular Interventional  Radiology (VIR) for routine drainage catheter evaluation and exchange in 7-10 days. Roanna Banning, MD [facility] Radiology Electronically Signed   By: Roanna Banning M.D.   On: 09/23/2023 17:47    Assessment/Plan: Intra-abdominal   Drain Location: LLQ Size: Fr size: 12 Fr Date of placement: 09/23/23  Currently to: Drain collection device: gravity 24 hour output:  Output by Drain (mL) 09/23/23 0701 - 09/23/23 1900 09/23/23 1901 - 09/24/23 0700 09/24/23 0701 - 09/24/23 1900 09/24/23 1901 - 09/25/23 0700 09/25/23 0701 - 09/25/23 1537  Patient has no LDAs of requested type attached.    Interval imaging/drain manipulation:  tbd  Current examination: Flushes/aspirates easily.  Insertion site unremarkable. Suture and stat lock in place. Dressed appropriately.   Plan: Potential discharge today per primary service. If inpatient the plan is below. If patient is discharged, follow the discharge planning.  Continue TID flushes with 5 cc NS. Record output Q shift. Dressing changes QD or PRN if soiled.  Call IR APP or on call IR MD if difficulty flushing or sudden change in drain output.  Repeat imaging/possible drain injection once output < 10 mL/QD (excluding flush material). Consideration for drain removal if output is < 10 mL/QD (excluding flush material), pending discussion with the providing surgical service.  Discharge planning: Pending discharge today per primary team.  Patient provided drain education by nursing staff. Patient informed to flush drain once a day and change the bandage every few days, or sooner if needed. Patient has an order placed for a 10-14 day outpatient follow up. He will be provided with a call to set this up. Patient instructed to document output and color daily. He was instructed to call with signs of infection, inability for fluid to drain, increasing pain or any questions/concerns.  Care team notified that follow  up orders are in place and patient can be discharged from the IR standpoint.      LOS: 3 days   I spent a total of 20 minutes in face to face in clinical consultation, greater than 50% of which was counseling/coordinating care for intra-abdominal abscess.  Anell Barr Briannia Laba PA-C 09/25/2023 3:32 PM

## 2023-09-25 NOTE — Progress Notes (Signed)
 Subjective: CC: Patient tolerating breakfast/early lunch yesterday but didn't have an appetite for dinner. No n/v. BM this am. LLQ improved and well controlled. Voiding. Mobilizing.   Patient is currently afebrile without tachycardia or hypotension.  Cx pending.   His wife is at bedside.   Objective: Vital signs in last 24 hours: Temp:  [98.2 F (36.8 C)] 98.2 F (36.8 C) (03/21 0625) Pulse Rate:  [69-86] 86 (03/21 0625) Resp:  [18] 18 (03/21 0625) BP: (134-160)/(93-97) 148/97 (03/21 0625) SpO2:  [98 %-100 %] 98 % (03/21 0625) Last BM Date : 09/22/23  Intake/Output from previous day: 03/20 0701 - 03/21 0700 In: 2250 [P.O.:2160; IV Piggyback:90] Out: 1195 [Urine:1175] Intake/Output this shift: No intake/output data recorded.  PE: Gen:  Alert, NAD, pleasant Pulm:  Rate and effort normal Abd: Soft, mild distension, llq abdominal ttp. Otherwise NT. +BS. Midline wound well healed. IR drain w/ bloody purulent drainage in gravity bag.  Psych: A&Ox3   Lab Results:  Recent Labs    09/23/23 0024 09/24/23 0505  WBC 9.7 4.0  HGB 11.5* 10.8*  HCT 34.7* 33.0*  PLT 207 201   BMET Recent Labs    09/23/23 0024 09/24/23 0505  NA 137 136  K 3.9 3.3*  CL 100 101  CO2 26 26  GLUCOSE 119* 101*  BUN 14 14  CREATININE 0.80 0.88  CALCIUM 8.7* 8.4*   PT/INR Recent Labs    09/23/23 0742  LABPROT 14.0  INR 1.1   CMP     Component Value Date/Time   NA 136 09/24/2023 0505   K 3.3 (L) 09/24/2023 0505   CL 101 09/24/2023 0505   CO2 26 09/24/2023 0505   GLUCOSE 101 (H) 09/24/2023 0505   BUN 14 09/24/2023 0505   CREATININE 0.88 09/24/2023 0505   CALCIUM 8.4 (L) 09/24/2023 0505   PROT 7.7 08/10/2023 1925   ALBUMIN 3.8 08/10/2023 1925   AST 31 08/10/2023 1925   ALT 37 08/10/2023 1925   ALKPHOS 73 08/10/2023 1925   BILITOT 1.3 (H) 08/10/2023 1925   GFRNONAA >60 09/24/2023 0505   GFRAA >60 03/11/2018 0810   Lipase     Component Value Date/Time   LIPASE 22  08/10/2023 1925    Studies/Results: CT GUIDED PERITONEAL/RETROPERITONEAL FLUID DRAIN BY PERC CATH Result Date: 09/23/2023 INDICATION: [id number] Hx of abdominal abscess [id number] EXAM: CT-GUIDED DRAINAGE CATHETER PLACEMENT INTO A LEFT LOWER QUADRANT ABSCESS COMPARISON:  CT AP, 09/22/2023 MEDICATIONS: The patient is currently admitted to the hospital and receiving intravenous antibiotics. The antibiotics were administered within an appropriate time frame prior to the initiation of the procedure. Benadryl 25 mg IV ANESTHESIA/SEDATION: Moderate (conscious) sedation was employed during this procedure. A total of Versed 4 mg and Fentanyl 100 mcg was administered intravenously. Moderate Sedation Time: 22 minutes. The patient's level of consciousness and vital signs were monitored continuously by radiology nursing throughout the procedure under my direct supervision. CONTRAST:  None FLUOROSCOPY TIME:  CT dose; 1217 mGycm COMPLICATIONS: None immediate. PROCEDURE: RADIATION DOSE REDUCTION: This exam was performed according to the departmental dose-optimization program which includes automated exposure control, adjustment of the mA and/or kV according to patient size and/or use of iterative reconstruction technique. Informed written consent was obtained from the patient after a discussion of the risks, benefits and alternatives to treatment. The patient was placed supine on the CT gantry and a pre procedural CT was performed re-demonstrating the known abscess/fluid collection within the LEFT lower quadrant. The procedure was  planned. A timeout was performed prior to the initiation of the procedure. The LEFT was prepped and draped in the usual sterile fashion. The overlying soft tissues were anesthetized with 1% lidocaine with epinephrine. Appropriate trajectory was planned with the use of a 22 gauge spinal needle. An 18 gauge trocar needle was advanced into the abscess/fluid collection and a short Amplatz super stiff wire was  coiled within the collection. Appropriate positioning was confirmed with a limited CT scan. The tract was serially dilated allowing placement of a 12 Fr drainage catheter. Appropriate positioning was confirmed with a limited postprocedural CT scan. 5 mL of purulent fluid was aspirated. The tube was connected to a drainage bag and sutured in place. A dressing was placed. The patient tolerated the procedure well without immediate post procedural complication. IMPRESSION: Successful CT-guided placement of a 12 Fr drainage catheter into the LEFT lower quadrant abscess with aspiration of 5 mL of purulent fluid. Samples were sent to the laboratory as requested by the ordering clinical team. RECOMMENDATIONS: The patient will return to Vascular Interventional Radiology (VIR) for routine drainage catheter evaluation and exchange in 7-10 days. Roanna Banning, MD [facility] Electronically Signed   By: Roanna Banning M.D.   On: 09/23/2023 17:47    Anti-infectives: Anti-infectives (From admission, onward)    Start     Dose/Rate Route Frequency Ordered Stop   09/22/23 2315  piperacillin-tazobactam (ZOSYN) IVPB 3.375 g        3.375 g 12.5 mL/hr over 240 Minutes Intravenous Every 8 hours 09/22/23 2218 09/29/23 2159        Assessment/Plan Hx of Exploratory laparotomy with drainage of abdominal abscess by Dr. Gerrit Friends on 08/11/23 Sigmoid diverticulitis with perforation with abscess versus small bowel diverticulitis with perforation and abscess  - CT 3/18 w/ mild to mod free air and wall thickening of sigmoid colon and SB loops in LLQ along w/ adjacent mesenteric inflammatory changes and a 7.9cm x 4.1cm fluid collection c/f IAA.  - Patient is afebrile without tachycardia or hypotension, non-toxic appearing, no peritonitis on exam, wbc wnl on last check. Cont trial non-operative management  - S/p IR drain 3/19. Drain per IR.  - Cont abx. IR drain cx pending.  -  Hopefully patient will improve with conservative treatment.  If patient fails to improve they may require repeating imaging, additional drain placement, or surgical intervention resulting in a exploratory laparotomy, bowel resection and possible ostomy. If patient improves with conservative management, he would need f/u with IR for repeat imaging +/- drain study, f/u with GI (supposed to see Dr. Ewing Schlein 3/25) for a colonoscopy (in 6-8 weeks) and possible discussions of elective surgery for bowel resection w/ anastomosis if etiology is identified/confirmed on w/u outpatient (small bowel vs colon). This was discussed with the patient and his wife at bedside.    Possible d/c this pm if tolerating diet.    FEN - Soft diet and shakes. SLIV.  VTE - SCDs, Lovenox  ID - Zosyn  I reviewed nursing notes, last 24 h vitals and pain scores, last 48 h intake and output, last 24 h labs and trends, and last 24 h imaging results.   LOS: 3 days    Jacinto Halim, [facility] 09/25/2023, 8:50 AM Please see Amion for pager number during day hours 7:00am-4:30pm

## 2023-09-25 NOTE — Discharge Summary (Addendum)
 Patient ID: Alexander [name] [id number] 02-18-1971 53 y.o.  Admit date: 09/22/2023 Discharge date: 09/25/2023   Discharge Diagnosis Hx of Exploratory laparotomy with drainage of abdominal abscess by Dr. Gerrit Friends on 08/11/23 Sigmoid diverticulitis with perforation with abscess versus small bowel diverticulitis with perforation and abscess   Consultants IR  HPI: Known to our service.Hx of Exploratory laparotomy with drainage of abdominal abscess by Dr. Gerrit Friends on 08/11/23. Drain was removed prior to discharge on POD 5.  Reports he was doing well postoperatively until Friday, 3/14, when he started developing LLQ abdominal pain.  Symptoms progressed and worsened on Sunday 3/16 with associated with fever and diaphoresis. No n/v. He was seen in our office on 3/17 and had labs and a CT A/P ordered.  WBC on 3/17 noted to be 17.6.  CT A/P yesterday showed   IMPRESSION: - Mild-to-moderate free intraperitoneal air, consistent with bowel perforation. - Increased wall thickening involving the sigmoid colon and small bowel loops in the left lower quadrant, with adjacent mesenteric inflammatory changes and abscess. This is felt most likely due to perforated sigmoid diverticulitis, with primary small bowel process considered less likely. - No evidence of bowel obstruction.   Patient was direct admitted to the hospital.  He reports he was actually feeling better yesterday with improvement of his LLQ pain and resolution of fever and diaphoresis. He was eating regular food at home without issues.  Reports his last BM was yesterday and liquidy but nonbloody.  He is still passing flatus. He has not required any as needed pain medications since admission.   Patient is currently afebrile without tachycardia or hypotension.  WBC 9.7 today.   He reports he has never had a colonoscopy. He is scheduled to see Dr. Ewing Schlein to arrange this next Tuesday. No person or fmhx of IBD or colon cancer.    His wife is at  bedside.   Procedures Dr. Roanna Banning - 09/23/23 - DRAINAGE CATHETER PLACEMENT into a LEFT LOWER QUADRANT ABSCESS   Hospital Course:  Patient admitted as above for Sigmoid diverticulitis with perforation with abscess versus small bowel diverticulitis with perforation and abscess. Patient was made npo, started on iv abx and IR was consulted. Pt underwent IR drainage on 3/19. Diet was advanced and tolerated. On 3/21 the patient was tolerating a soft diet, symptomatically improved, having bowel function, HDS (no fever, tachycardia, or hypotension) and was felt stable for d/c home. Recommend f/u with IR for for repeat imaging +/- drain study and f/u with GI (supposed to see Dr. Ewing Schlein 3/25 per report) for a colonoscopy (in 6-8 weeks). Will arrange f/u with [facility] as well. If colonoscopy reveals etiology of his infection, then could f/u to discuss elective resection. At time of d/c his cx is still pending. He has a pcn allergy. Will send home on 10d course of cipro/flagyl. Discussed risk of tendinitis/tendon rupture and to avoid high impact activities w/ cipro. Discussed discharge instructions, restrictions and return/call back precautions. RD to meet w/ pt before d/c to discuss dietary recommendations until f/u.   Physical Exam: Gen:  Alert, NAD, pleasant Pulm:  Rate and effort normal Abd: Soft, mild distension, llq abdominal ttp that is improved from earlier today. Otherwise NT. +BS. Midline wound well healed. IR drain w/ bloody purulent drainage in gravity bag.    Allergies as of 09/25/2023       Reactions   Penicillins Other (See Comments)   He isn't sure of the reaction. Had reaction as a child  Medication List     TAKE these medications    acetaminophen 500 MG tablet Commonly known as: TYLENOL Take 2 tablets (1,000 mg total) by mouth every 8 (eight) hours as needed.   Aspirin 81 MG Caps Take 81 mg by mouth daily.   atorvastatin 40 MG tablet Commonly known as: LIPITOR Take 40 mg  by mouth at bedtime.   ciprofloxacin 500 MG tablet Commonly known as: CIPRO Take 1 tablet (500 mg total) by mouth 2 (two) times daily for 10 days.   CoQ10 200 MG Caps Take 200 mg by mouth daily.   docusate sodium 100 MG capsule Commonly known as: COLACE Take 1 capsule (100 mg total) by mouth 2 (two) times daily as needed for mild constipation.   feeding supplement Liqd Take 237 mLs by mouth 2 (two) times daily between meals. Start taking on: September 26, 2023   Ensure Max Protein Liqd Take 330 mLs (11 oz total) by mouth daily. Start taking on: September 26, 2023   methocarbamol 500 MG tablet Commonly known as: ROBAXIN Take 1 tablet (500 mg total) by mouth every 8 (eight) hours as needed for muscle spasms.   metroNIDAZOLE 500 MG tablet Commonly known as: FLAGYL Take 1 tablet (500 mg total) by mouth 3 (three) times daily for 10 days.   Normal Saline Flush 0.9 % Soln Please flush the drain once daily with 5mL of normal saline   oxyCODONE 5 MG immediate release tablet Commonly known as: Oxy IR/ROXICODONE Take 1 tablet (5 mg total) by mouth every 6 (six) hours as needed for breakthrough pain.   Repatha SureClick 140 MG/ML Soaj Generic drug: Evolocumab ADMINISTER 1 ML UNDER THE SKIN EVERY 14 DAYS What changed: See the new instructions.          Follow-up Information     Mugweru, Cletis Athens, MD Follow up.   Specialties: Interventional Radiology, Diagnostic Radiology, Radiology Why: Please call radiology to confirm your appointment date and time. Contact information: [address] [place] [address] [phone]         Darnell Level, MD Follow up on 10/20/2023.   Specialty: General Surgery Why: 315pm. Please arrive [address] Contact information: [address] [place] [address] [phone]         Vida Rigger, MD Follow up.   Specialty: Gastroenterology Why: Please keep your appointment with Dr. Ewing Schlein to discuss timing of  colonoscopy Contact information: [address] [place] [address] [phone]                 Signed: Leary Roca, [facility] Surgery 09/25/2023, 4:23 PM Please see Amion for pager number during day hours 7:00am-4:30pm

## 2023-09-25 NOTE — Progress Notes (Signed)
 IV removed, dressed, belongings packed, instructions reviewed with understanding verbalized, drain dressing replaced prior to leaving, transferred to front entrance via wheelchair.

## 2023-09-25 NOTE — Plan of Care (Signed)

## 2023-09-28 ENCOUNTER — Other Ambulatory Visit: Payer: Self-pay | Admitting: Surgery

## 2023-09-28 DIAGNOSIS — K651 Peritoneal abscess: Secondary | ICD-10-CM

## 2023-09-28 LAB — AEROBIC/ANAEROBIC CULTURE W GRAM STAIN (SURGICAL/DEEP WOUND)

## 2023-09-29 DIAGNOSIS — Z8719 Personal history of other diseases of the digestive system: Secondary | ICD-10-CM | POA: Diagnosis not present

## 2023-10-05 ENCOUNTER — Ambulatory Visit
Admission: RE | Admit: 2023-10-05 | Discharge: 2023-10-05 | Disposition: A | Discharge status: Home / Self Care | Source: Ambulatory Visit | Attending: Surgery | Admitting: Surgery

## 2023-10-05 ENCOUNTER — Ambulatory Visit
Admission: RE | Admit: 2023-10-05 | Discharge: 2023-10-05 | Disposition: A | Discharge status: Home / Self Care | Source: Ambulatory Visit | Attending: Radiology | Admitting: Radiology

## 2023-10-05 DIAGNOSIS — K651 Peritoneal abscess: Secondary | ICD-10-CM

## 2023-10-05 MED ORDER — IOPAMIDOL (ISOVUE-300) INJECTION 61%
100.0000 mL | Freq: Once | INTRAVENOUS | Status: AC | PRN
  Administered 2023-10-05: 100 mL via INTRAVENOUS

## 2023-10-05 NOTE — Progress Notes (Signed)
 Referring Physician(s): Dr. Gerrit Friends  Chief Complaint: The patient is seen in follow up today s/p intra-abdominal abscess drain placed 09/23/23  History of present illness: Alexander [name], 53 year old male, has a medical history significant for small bowel perforation February 2025. He presented to the ED with abdominal pain and was found to have a small bowel perforation with abscess. He was taken to the OR 08/11/23 for exploratory laparotomy with drainage of the abscess. A JP drain was placed intra-operatively and was removed prior to his discharge on 08/16/23.   He followed up with his Surgeon mid-March and reported some abdominal discomfort. His WBC was also elevated and a CT scan was ordered. This showed a large LLQ fluid/gas collection and he was direct-admitted to [facility] hospital. Interventional Radiology was consulted for drain placement and this was performed 09/23/23. He was discharged from the hospital 09/25/23.   He presents today to the [facility]iology outpatient clinic for a drain evaluation with CT imaging and contrast drain injection under fluoroscopy. He reports minimal drain output for the last several days - approximately 2-5 ml. He has been flushing the drain once daily with 5 ml NS. He is still on oral antibiotics. He denies pain, discomfort, nausea, fevers chills. He is eating/drinking well and going to the bathroom without difficulty.   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(HCC)    Hyperlipidemia     Past Surgical History:  Procedure Laterality Date   deviated septal surgery  07/08/1995   HERNIA REPAIR  07/07/1997   LAPAROTOMY N/A 08/11/2023   Procedure: EXPLORATORY LAPAROTOMY, DRAINAGE OF PELVIC ABSCESS;  Surgeon: Darnell Level, MD;  Location: [facility] ORS;  Service: General;  Laterality: N/A;    Allergies: Penicillins  Medications: Prior to Admission medications   Medication  Sig Start Date End Date Taking? Authorizing Provider  acetaminophen (TYLENOL) 500 MG tablet Take 2 tablets (1,000 mg total) by mouth every 8 (eight) hours as needed. 09/25/23   Maczis, Elmer Sow, PA-C  Aspirin 81 MG CAPS Take 81 mg by mouth daily.    [provider]  atorvastatin (LIPITOR) 40 MG tablet Take 40 mg by mouth at bedtime. 04/08/13   [provider]  ciprofloxacin (CIPRO) 500 MG tablet Take 1 tablet (500 mg total) by mouth 2 (two) times daily for 10 days. 09/25/23 10/05/23  Maczis, Elmer Sow, PA-C  Coenzyme Q10 (COQ10) 200 MG CAPS Take 200 mg by mouth daily.    [provider]  docusate sodium (COLACE) 100 MG capsule Take 1 capsule (100 mg total) by mouth 2 (two) times daily as needed for mild constipation. 09/25/23   Maczis, Elmer Sow, PA-C  Ensure Max Protein (ENSURE MAX PROTEIN) LIQD Take 330 mLs (11 oz total) by mouth daily. 09/26/23   Maczis, Elmer Sow, PA-C  feeding supplement (ENSURE ENLIVE / ENSURE PLUS) LIQD Take 237 mLs by mouth 2 (two) times daily between meals. 09/26/23   Maczis, Elmer Sow, PA-C  methocarbamol (ROBAXIN) 500 MG tablet Take 1 tablet (500 mg total) by mouth every 8 (eight) hours as needed for muscle spasms. 09/25/23   Maczis, Elmer Sow, PA-C  metroNIDAZOLE (FLAGYL) 500 MG tablet Take 1 tablet (500 mg total) by mouth 3 (three) times daily for 10 days. 09/25/23 10/05/23  Maczis, Elmer Sow, PA-C  oxyCODONE (OXY IR/ROXICODONE) 5 MG immediate release tablet Take 1 tablet (5 mg total)  by mouth every 6 (six) hours as needed for breakthrough pain. 09/25/23   Maczis, Elmer Sow, PA-C  REPATHA SURECLICK 140 MG/ML SOAJ ADMINISTER 1 ML UNDER THE SKIN EVERY 14 DAYS Patient taking differently: Inject 140 mg into the muscle every 14 (fourteen) days. 09/01/23   Hilty, Lisette Abu, MD  Sodium Chloride Flush (NORMAL SALINE FLUSH) 0.9 % SOLN Please flush the drain once daily with 5mL of normal saline 09/25/23   Caperilla, Marissa N, PA     Family History  Problem  Relation Age of Onset   Sudden death Father        sudeen cardiac death in 68s   Cancer Mother        abdominal ca   Heart attack Maternal Grandfather     Social History   Socioeconomic History   Marital status: Married    Spouse name: Not on file   Number of children: 0   Years of education: Not on file   Highest education level: Not on file  Occupational History   Occupation: business owner    Employer: [employer]  Tobacco Use   Smoking status: Never   Smokeless tobacco: Former    Types: Snuff   Tobacco comments:    dip - 25 years ago  Substance and Sexual Activity   Alcohol use: No    Alcohol/week: 0.0 standard drinks of alcohol   Drug use: No   Sexual activity: Not on file  Other Topics Concern   Not on file  Social History Narrative   Not on file   Social Drivers of Health   Financial Resource Strain: Not on file  Food Insecurity: No Food Insecurity (09/22/2023)   Hunger Vital Sign    Worried About Running Out of Food in the Last Year: Never true    Ran Out of Food in the Last Year: Never true  Transportation Needs: No Transportation Needs (09/22/2023)   PRAPARE - Administrator, Civil Service (Medical): No    Lack of Transportation (Non-Medical): No  Physical Activity: Not on file  Stress: Not on file  Social Connections: Socially Integrated (08/11/2023)   Social Connection and Isolation Panel [NHANES]    Frequency of Communication with Friends and Family: More than three times a week    Frequency of Social Gatherings with Friends and Family: More than three times a week    Attends Religious Services: More than 4 times per year    Active Member of [employer] or Organizations: Yes    Attends Engineer, structural: More than 4 times per year    Marital Status: Married     Vital Signs: There were no vitals taken for this visit.  Physical Exam Constitutional:      General: He is not in acute distress.    Appearance: He is not  ill-appearing.  Pulmonary:     Effort: Pulmonary effort is normal.  Abdominal:     Palpations: Abdomen is soft.     Comments: LLQ drain to gravity. Stat-lock intact with a small amount of dried drainage. Site is mildly tender to palpation. Scant amount of tan/purulent fluid in bag.   Skin:    General: Skin is warm and dry.  Neurological:     Mental Status: He is alert and oriented to person, place, and time.  Psychiatric:        Mood and Affect: Mood normal.        Behavior: Behavior normal.  Thought Content: Thought content normal.        Judgment: Judgment normal.     Imaging: No results found.  Labs:  CBC: Recent Labs    08/14/23 0454 08/15/23 0543 09/23/23 0024 09/24/23 0505  WBC 7.2 6.1 9.7 4.0  HGB 12.1* 11.3* 11.5* 10.8*  HCT 36.3* 33.8* 34.7* 33.0*  PLT 245 284 207 201    COAGS: Recent Labs    09/23/23 0742  INR 1.1    BMP: Recent Labs    08/10/23 1925 08/12/23 0502 09/23/23 0024 09/24/23 0505  NA 136 135 137 136  K 3.8 3.9 3.9 3.3*  CL 96* 98 100 101  CO2 26 25 26 26   GLUCOSE 115* 139* 119* 101*  BUN 16 12 14 14   CALCIUM 9.1 8.3* 8.7* 8.4*  CREATININE 0.93 0.74 0.80 0.88  GFRNONAA >60 >60 >60 >60    LIVER FUNCTION TESTS: Recent Labs    08/10/23 1925  BILITOT 1.3*  AST 31  ALT 37  ALKPHOS 73  PROT 7.7  ALBUMIN 3.8    Assessment:  Small bowel perforation with abscess s/p exploratory laparotomy with abscess drainage. Post-procedure course complicated by abscess recurrence with drain placement in IR 09/23/23.   CT imaging today shows resolution of the fluid collection. 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 The drain was left in place and the patient was instructed to discontinue flushing the drain. He was given a new gravity bag and a fresh dressing was placed over the site. He will follow up with his Surgeon April 15. He will be scheduled for a repeat drain evaluation with IR April 16.    Signed: Mickie Kay, NP 10/05/2023, 10:31 AM   Please refer to Dr. Kenna Gilbert attestation of this note for management and plan.

## 2023-10-06 ENCOUNTER — Other Ambulatory Visit: Payer: Self-pay | Admitting: Surgery

## 2023-10-06 DIAGNOSIS — K651 Peritoneal abscess: Secondary | ICD-10-CM

## 2023-10-15 ENCOUNTER — Encounter: Payer: Self-pay | Admitting: Dietician

## 2023-10-15 ENCOUNTER — Ambulatory Visit: Admitting: Dietician

## 2023-10-15 ENCOUNTER — Encounter: Attending: Internal Medicine | Admitting: Dietician

## 2023-10-15 VITALS — Ht 74.0 in | Wt 175.8 lb

## 2023-10-15 DIAGNOSIS — K2100 Gastro-esophageal reflux disease with esophagitis, without bleeding: Secondary | ICD-10-CM | POA: Diagnosis not present

## 2023-10-15 NOTE — Progress Notes (Signed)
 Medical Nutrition Therapy  Appointment Start time:  1026  Appointment End time:  1121  Primary concerns today: diverticulitis  Referral diagnosis: K21.9 GERD, unspecified with esophagitis present Preferred learning style: no preference indicated (auditory, visual, hands on, no preference indicated) Learning readiness: ready (not ready, contemplating, ready, change in progress)  NUTRITION ASSESSMENT   Anthropometrics Weight today: 175.8 lbs Height: 74 inches  Body Composition Scale 10/15/2023  Current Body Weight 175.8  Total Body Fat % 13.1  Visceral Fat 6  Fat-Free Mass % 86.8   Total Body Water % 67.8  Muscle-Mass lbs 33.0  BMI 22.5  Body Fat Displacement          Torso  lbs 14.2         Left Leg  lbs 2.8         Right Leg  lbs 2.8         Left Arm  lbs 1.4         Right Arm  lbs 1.4   Clinical Medical Hx: hyperlipidemia Medications: atorvastatin, aspirin (81 mg),  Labs: glucose 101; calcium 8.7; hemoglobin 10.8; HCT 33.0; potassium 3.3 Notable Signs/Symptoms: nothing identified  Lifestyle & Dietary Hx  Pt arrived with his supportive wife. Wife states they are working on eating pattern changes together. Pt states he is going to have a colonoscopy in June to confirm diagnosis of diverticulosis. Pt states the flare ups and the intra abdominal abscess in his colon came out of now where. Pt still has a drain to be removed in the near future. 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 Pt and his wife state that pt has never had to worry about excess weight, and has eating the same all his life, without regard to what he was eating. Pt states he does not suffer from GERD and has not had it in the past. Pt states he as lost about 15 pounds since surgery. Pt states he would like to gain about 5 pounds back, stating 180 would be a good weight for him.  Estimated daily fluid intake: 32-48 oz Supplements: n/a Sleep: it has gotten  better, but with drain in he has to sleep on his side, stating he is a stomach sleeper. Stress / self-care: 8-9 on a scale of 1-10, stating it has been a little higher since surgery; Pt states prayer helps with stress Current average weekly physical activity: walking since surgery; previous core and upper body workouts.  24-Hr Dietary Recall First Meal: fruit, bagel, butter, cinnamon sugar, orange juice, banana, or muffin, greek yogurt, oatmeal Snack: protein shake Second Meal: piemeto cheese sandwich, whole wheat bread, applesauce, vanilla pudding or rice pudding Snack: peanut butter with saltine or graham crackers Third Meal: soup, chicken pie, rice, mac and cheese, green beans, grilled chicken with dumplings and mac and cheese. Snack: used to have ice cream every night before  Beverages: water, orange juice, propel flavored water  NUTRITION DIAGNOSIS  NB-1.1 Food and nutrition-related knowledge deficit As related to diverticulosis.  As evidenced by new/recent flare up and surgery.  NUTRITION INTERVENTION  Nutrition education (E-1) on the following topics:  Slowly increase the amount of fiber you eat to 25 to 35 grams per day. Eat whole grain breads and cereals. Look for choices with 100% whole wheat, rye, oats, or bran as the first or second ingredient. Have Rondrick Barreira or wild rice instead of white rice or potatoes. Enjoy a variety of  grains such as barley, oats, farro, kamut, and quinoa. Bake with whole wheat flour. You can use it to replace some white or all-purpose flour in recipes. Add dried beans and peas to casseroles or soups. Eat fruits and vegetables with peels or skins on. Choose fresh fruit and vegetables instead of juices. Check the Nutrition Facts labels and try to choose products with at least 4 g dietary fiber per serving. Drink at least 8 cups of fluid per day. You may need even more fluid as you eat higher amounts of fiber. Fluid helps your body process fiber without  discomfort. To boost hemoglobin levels, focus on iron-rich foods like red meat, poultry, fish, beans, lentils, spinach, and dried fruits, while also incorporating vitamin C-rich foods to enhance iron absorption. Chewing well significantly aids digestion. It breaks down food into smaller pieces, stimulates saliva production (which contains digestive enzymes), and signals the body to prepare for digestion. Proper chewing is the first step in the digestive process, ensuring efficient breakdown and absorption of nutrients. Fiber, particularly insoluble fiber, stimulates peristalsis, the muscle contractions that move food through the digestive tract, by adding bulk to stool and prompting the intestines to move it along.  Handouts Provided Include  High Fiber Nutrition Therapy (Nutrition Care Manual) High Iron Foods (Nutrition Care Manual) Meal Ideas Handout Using the Plate Method for Healthy Meal Planning (SANOFI)  Learning Style & Readiness for Change Teaching method utilized: Visual & Auditory  Demonstrated degree of understanding via: Teach Back  Barriers to learning/adherence to lifestyle change: nothing identified  Goals Increase fiber slowly; aim for 25-35 grams per day Aim for 64 oz of fluids   MONITORING & EVALUATION Dietary intake, weekly physical activity.  Next Steps  Patient is to return in 2-3 months for follow-up.

## 2023-10-21 ENCOUNTER — Ambulatory Visit
Admission: RE | Admit: 2023-10-21 | Discharge: 2023-10-21 | Disposition: A | Discharge status: Home / Self Care | Source: Ambulatory Visit | Attending: Surgery | Admitting: Surgery

## 2023-10-21 DIAGNOSIS — K651 Peritoneal abscess: Secondary | ICD-10-CM

## 2023-10-21 DIAGNOSIS — Z4803 Encounter for change or removal of drains: Secondary | ICD-10-CM | POA: Diagnosis not present

## 2023-10-21 HISTORY — PX: IR RADIOLOGIST EVAL & MGMT: IMG5224

## 2023-10-21 MED ORDER — IOPAMIDOL (ISOVUE-300) INJECTION 61%
100.0000 mL | Freq: Once | INTRAVENOUS | Status: AC | PRN
  Administered 2023-10-21: 100 mL via INTRAVENOUS

## 2023-10-21 NOTE — Progress Notes (Signed)
 Referring Physician(s): Gerkin,Todd  Chief Complaint: The patient is seen in follow up today s/p intra abdominal abscess drain placed 09/23/23 in IR   History of present illness:  Small bowel perforation Feb 2025. With [facility] to OR 08/11/23 and DCd home 08/16/23 after surgical drain removal Noted new discomfort  mid March Imaging revealing intra abdominal abscess IR placed drain 09/23/23 Was seen in follow up 10/05/23 at IR [facility]      CT imaging shows resolution of the fluid collection. 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 The drain was left in place and the patient was instructed to discontinue flushing the drain. He was given a new gravity bag and a fresh dressing was placed over the site.   Here today for repeat evaluation CT and drain injection  Denies pain Denies N/V Denies fever OP minimal to none daily Has not flushed at all since 3/31 - last visit  Has visit with Dr Gerrit Friends tomorrow    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(HCC)    Hyperlipidemia     Past Surgical History:  Procedure Laterality Date   deviated septal surgery  07/08/1995   HERNIA REPAIR  07/07/1997   LAPAROTOMY N/A 08/11/2023   Procedure: EXPLORATORY LAPAROTOMY, DRAINAGE OF PELVIC ABSCESS;  Surgeon: Darnell Level, MD;  Location: [facility];  Service: General;  Laterality: N/A;    Allergies: Penicillins  Medications: Prior to Admission medications   Medication Sig Start Date End Date Taking? Authorizing Provider  acetaminophen (TYLENOL) 500 MG tablet Take 2 tablets (1,000 mg total) by mouth every 8 (eight) hours as needed. 09/25/23   Maczis, Elmer Sow, PA-C  Aspirin 81 MG CAPS Take 81 mg by mouth daily.    [provider]  atorvastatin (LIPITOR) 40 MG tablet Take 40 mg by mouth at bedtime. 04/08/13   [provider]  Coenzyme Q10 (COQ10) 200 MG CAPS  Take 200 mg by mouth daily.    [provider]  docusate sodium (COLACE) 100 MG capsule Take 1 capsule (100 mg total) by mouth 2 (two) times daily as needed for mild constipation. 09/25/23   Maczis, Elmer Sow, PA-C  Ensure Max Protein (ENSURE MAX PROTEIN) LIQD Take 330 mLs (11 oz total) by mouth daily. 09/26/23   Maczis, Elmer Sow, PA-C  feeding supplement (ENSURE ENLIVE / ENSURE PLUS) LIQD Take 237 mLs by mouth 2 (two) times daily between meals. 09/26/23   Maczis, Elmer Sow, PA-C  methocarbamol (ROBAXIN) 500 MG tablet Take 1 tablet (500 mg total) by mouth every 8 (eight) hours as needed for muscle spasms. 09/25/23   Maczis, Elmer Sow, PA-C  oxyCODONE (OXY IR/ROXICODONE) 5 MG immediate release tablet Take 1 tablet (5 mg total) by mouth every 6 (six) hours as needed for breakthrough pain. 09/25/23   Maczis, Elmer Sow, PA-C  REPATHA SURECLICK 140 MG/ML SOAJ ADMINISTER 1 ML UNDER THE SKIN EVERY 14 DAYS Patient taking differently: Inject 140 mg into the muscle every 14 (fourteen) days. 09/01/23   Hilty, Lisette Abu, MD  Sodium Chloride Flush (NORMAL SALINE FLUSH) 0.9 % SOLN Please flush the drain once daily with 5mL of normal saline 09/25/23   Caperilla, Marissa N, PA     Family History  Problem Relation Age of Onset   Sudden death Father        sudeen cardiac death  in 8s   Cancer Mother        abdominal ca   Heart attack Maternal Grandfather     Social History   Socioeconomic History   Marital status: Married    Spouse name: Not on file   Number of children: 0   Years of education: Not on file   Highest education level: Not on file  Occupational History   Occupation: business owner    Employer: [employer]  Tobacco Use   Smoking status: Never   Smokeless tobacco: Former    Types: Snuff   Tobacco comments:    dip - 25 years ago  Substance and Sexual Activity   Alcohol use: No    Alcohol/week: 0.0 standard drinks of alcohol   Drug use: No   Sexual activity: Not on file   Other Topics Concern   Not on file  Social History Narrative   Not on file   Social Drivers of Health   Financial Resource Strain: Not on file  Food Insecurity: No Food Insecurity (09/22/2023)   Hunger Vital Sign    Worried About Running Out of Food in the Last Year: Never true    Ran Out of Food in the Last Year: Never true  Transportation Needs: No Transportation Needs (09/22/2023)   PRAPARE - Administrator, Civil Service (Medical): No    Lack of Transportation (Non-Medical): No  Physical Activity: Not on file  Stress: Not on file  Social Connections: Socially Integrated (08/11/2023)   Social Connection and Isolation Panel [NHANES]    Frequency of Communication with Friends and Family: More than three times a week    Frequency of Social Gatherings with Friends and Family: More than three times a week    Attends Religious Services: More than 4 times per year    Active Member of [employer] or Organizations: Yes    Attends Engineer, structural: More than 4 times per year    Marital Status: Married     Vital Signs: There were no vitals taken for this visit.  Physical Exam Skin:    General: Skin is warm.     Comments: Skin site is clean and dry No bleeding NT no sign of infection Drain injection shows NO FISTULA per Dr Lowella Dandy     Imaging: No results found.  Labs:  CBC: Recent Labs    08/14/23 0454 08/15/23 0543 09/23/23 0024 09/24/23 0505  WBC 7.2 6.1 9.7 4.0  HGB 12.1* 11.3* 11.5* 10.8*  HCT 36.3* 33.8* 34.7* 33.0*  PLT 245 284 207 201    COAGS: Recent Labs    09/23/23 0742  INR 1.1    BMP: Recent Labs    08/10/23 1925 08/12/23 0502 09/23/23 0024 09/24/23 0505  NA 136 135 137 136  K 3.8 3.9 3.9 3.3*  CL 96* 98 100 101  CO2 26 25 26 26   GLUCOSE 115* 139* 119* 101*  BUN 16 12 14 14   CALCIUM 9.1 8.3* 8.7* 8.4*  CREATININE 0.93 0.74 0.80 0.88  GFRNONAA >60 >60 >60 >60    LIVER FUNCTION TESTS: Recent Labs    08/10/23 1925   BILITOT 1.3*  AST 31  ALT 37  ALKPHOS 73  PROT 7.7  ALBUMIN 3.8    Assessment:  Intra abdominal abscess drain evaluation CT showing resolution of abscess and Drain injection showing NO FISTULA per Dr Caryn Bee removed without complication Dressing placed To see [facility] tomorrow for further plans   Signed:  Ellen Guppy, PA-C 10/21/2023, 9:34 AM   Please refer to Dr. Julietta Ogren attestation of this note for management and plan.

## 2023-11-16 DIAGNOSIS — K5792 Diverticulitis of intestine, part unspecified, without perforation or abscess without bleeding: Secondary | ICD-10-CM | POA: Diagnosis not present

## 2023-11-16 DIAGNOSIS — R935 Abnormal findings on diagnostic imaging of other abdominal regions, including retroperitoneum: Secondary | ICD-10-CM | POA: Diagnosis not present

## 2023-12-03 DIAGNOSIS — K6389 Other specified diseases of intestine: Secondary | ICD-10-CM | POA: Diagnosis not present

## 2023-12-03 DIAGNOSIS — Z1211 Encounter for screening for malignant neoplasm of colon: Secondary | ICD-10-CM | POA: Diagnosis not present

## 2023-12-03 DIAGNOSIS — R933 Abnormal findings on diagnostic imaging of other parts of digestive tract: Secondary | ICD-10-CM | POA: Diagnosis not present

## 2023-12-14 DIAGNOSIS — K651 Peritoneal abscess: Secondary | ICD-10-CM | POA: Diagnosis not present

## 2023-12-18 ENCOUNTER — Other Ambulatory Visit: Payer: Self-pay | Admitting: Urology

## 2023-12-31 ENCOUNTER — Ambulatory Visit: Admitting: Dietician

## 2024-01-13 DIAGNOSIS — R7301 Impaired fasting glucose: Secondary | ICD-10-CM | POA: Diagnosis not present

## 2024-01-13 DIAGNOSIS — E785 Hyperlipidemia, unspecified: Secondary | ICD-10-CM | POA: Diagnosis not present

## 2024-01-13 DIAGNOSIS — Z125 Encounter for screening for malignant neoplasm of prostate: Secondary | ICD-10-CM | POA: Diagnosis not present

## 2024-01-13 DIAGNOSIS — Z1212 Encounter for screening for malignant neoplasm of rectum: Secondary | ICD-10-CM | POA: Diagnosis not present

## 2024-01-20 ENCOUNTER — Ambulatory Visit: Payer: Self-pay | Admitting: Surgery

## 2024-01-20 DIAGNOSIS — Z01818 Encounter for other preprocedural examination: Secondary | ICD-10-CM

## 2024-01-20 NOTE — Progress Notes (Signed)
 Sent message, via epic in basket, requesting orders in epic from Careers adviser.

## 2024-01-21 DIAGNOSIS — Z0000 Encounter for general adult medical examination without abnormal findings: Secondary | ICD-10-CM | POA: Diagnosis not present

## 2024-01-21 DIAGNOSIS — R82998 Other abnormal findings in urine: Secondary | ICD-10-CM | POA: Diagnosis not present

## 2024-01-21 DIAGNOSIS — I4510 Unspecified right bundle-branch block: Secondary | ICD-10-CM | POA: Diagnosis not present

## 2024-01-21 DIAGNOSIS — E785 Hyperlipidemia, unspecified: Secondary | ICD-10-CM | POA: Diagnosis not present

## 2024-01-26 NOTE — Patient Instructions (Signed)
 SURGICAL WAITING ROOM VISITATION  Patients having surgery or a procedure may have no more than 2 support people in the waiting area - these visitors may rotate.    Children under the age of 74 must have an adult with them who is not the patient.  Visitors with respiratory illnesses are discouraged from visiting and should remain at home.  If the patient needs to stay at the hospital during part of their recovery, the visitor guidelines for inpatient rooms apply. Pre-op nurse will coordinate an appropriate time for 1 support person to accompany patient in pre-op.  This support person may not rotate.    Please refer to the [facility] website for the visitor guidelines for Inpatients (after your surgery is over and you are in a regular room).       Your procedure is scheduled on:  02/05/2024    Report to [facility] Main Entrance    Report to admitting at   [phone]   Call this number if you have problems the morning of surgery [phone]    Clear liquid diet on day of bowel prep.     After Midnight you may have the following liquids until ___ 0430___ AM  DAY OF SURGERY  Water Non-Citrus Juices (without pulp, NO RED-Apple, White grape, White cranberry) Black Coffee (NO MILK/CREAM OR CREAMERS, sugar ok)  Clear Tea (NO MILK/CREAM OR CREAMERS, sugar ok) regular and decaf                             Plain Jell-O (NO RED)                                           Fruit ices (not with fruit pulp, NO RED)                                     Popsicles (NO RED)                                                               Sports drinks like Gatorade (NO RED)              Drink 2 Ensure/G2 drinks AT 10:00 PM the night before surgery.        The day of surgery:  Drink ONE (1) Pre-Surgery Clear Ensure or G2 at  0430AM the morning of surgery. Drink in one sitting. Do not sip.  This drink was given to you during your hospital  pre-op appointment visit. Nothing else to drink after  completing the  Pre-Surgery Clear Ensure or G2.          If you have questions, please contact your surgeon's office.   FOLLOW BOWEL PREP AND ANY ADDITIONAL PRE OP INSTRUCTIONS YOU RECEIVED FROM YOUR SURGEON'S OFFICE!!!     Oral Hygiene is also important to reduce your risk of infection.  Remember - BRUSH YOUR TEETH THE MORNING OF SURGERY WITH YOUR REGULAR TOOTHPASTE  DENTURES WILL BE REMOVED PRIOR TO SURGERY PLEASE DO NOT APPLY Poly grip OR ADHESIVES!!!   Do NOT smoke after Midnight   Stop all vitamins and herbal supplements 7 days before surgery.   Take these medicines the morning of surgery with A SIP OF WATER:  none   DO NOT TAKE ANY ORAL DIABETIC MEDICATIONS DAY OF YOUR SURGERY  Bring CPAP mask and tubing day of surgery.                              You may not have any metal on your body including hair pins, jewelry, and body piercing             Do not wear make-up, lotions, powders, perfumes/cologne, or deodorant  Do not wear nail polish including gel and S&S, artificial/acrylic nails, or any other type of covering on natural nails including finger and toenails. If you have artificial nails, gel coating, etc. that needs to be removed by a nail salon please have this removed prior to surgery or surgery may need to be canceled/ delayed if the surgeon/ anesthesia feels like they are unable to be safely monitored.   Do not shave  48 hours prior to surgery.               Men may shave face and neck.   Do not bring valuables to the hospital. [facility] IS NOT             RESPONSIBLE   FOR VALUABLES.   Contacts, glasses, dentures or bridgework may not be worn into surgery.   Bring small overnight bag day of surgery.   DO NOT BRING YOUR HOME MEDICATIONS TO THE HOSPITAL. PHARMACY WILL DISPENSE MEDICATIONS LISTED ON YOUR MEDICATION LIST TO YOU DURING YOUR ADMISSION IN THE HOSPITAL!    Patients discharged on the day of surgery will not be  allowed to drive home.  Someone NEEDS to stay with you for the first 24 hours after anesthesia.   Special Instructions: Bring a copy of your healthcare power of attorney and living will documents the day of surgery if you haven't scanned them before.              Please read over the following fact sheets you were given: IF YOU HAVE QUESTIONS ABOUT YOUR PRE-OP INSTRUCTIONS PLEASE CALL [phone].   If you received a COVID test during your pre-op visit  it is requested that you wear a mask when out in public, stay away from anyone that may not be feeling well and notify your surgeon if you develop symptoms. If you test positive for Covid or have been in contact with anyone that has tested positive in the last 10 days please notify you surgeon.    [facility] - Preparing for Surgery Before surgery, you can play an important role.  Because skin is not sterile, your skin needs to be as free of germs as possible.  You can reduce the number of germs on your skin by washing with CHG (chlorahexidine gluconate) soap before surgery.  CHG is an antiseptic cleaner which kills germs and bonds with the skin to continue killing germs even after washing. Please DO NOT use if you have an allergy to CHG or antibacterial soaps.  If your skin becomes reddened/irritated stop using the CHG and inform your nurse when you  arrive at Short Stay. Do not shave (including legs and underarms) for at least 48 hours prior to the first CHG shower.  You may shave your face/neck. Please follow these instructions carefully:  1.  Shower with CHG Soap the night before surgery and the  morning of Surgery.  2.  If you choose to wash your hair, wash your hair first as usual with your  normal  shampoo.  3.  After you shampoo, rinse your hair and body thoroughly to remove the  shampoo.                           4.  Use CHG as you would any other liquid soap.  You can apply chg directly  to the skin and wash                       Gently with a  scrungie or clean washcloth.  5.  Apply the CHG Soap to your body ONLY FROM THE NECK DOWN.   Do not use on face/ open                           Wound or open sores. Avoid contact with eyes, ears mouth and genitals (private parts).                       Wash face,  Genitals (private parts) with your normal soap.             6.  Wash thoroughly, paying special attention to the area where your surgery  will be performed.  7.  Thoroughly rinse your body with warm water from the neck down.  8.  DO NOT shower/wash with your normal soap after using and rinsing off  the CHG Soap.                9.  Pat yourself dry with a clean towel.            10.  Wear clean pajamas.            11.  Place clean sheets on your bed the night of your first shower and do not  sleep with pets. Day of Surgery : Do not apply any lotions/deodorants the morning of surgery.  Please wear clean clothes to the hospital/surgery center.  FAILURE TO FOLLOW THESE INSTRUCTIONS MAY RESULT IN THE CANCELLATION OF YOUR SURGERY PATIENT SIGNATURE_________________________________  NURSE SIGNATURE__________________________________  ________________________________________________________________________

## 2024-01-26 NOTE — Progress Notes (Signed)
 Anesthesia Review:  PCP: Cardiologist :  PPM/ ICD: Device Orders: Rep Notified:  Chest x-ray : EKG :  Ct Card-2023  Echo : Stress test: Cardiac Cath :   Activity level:  Sleep Study/ CPAP : Fasting Blood Sugar :      / Checks Blood Sugar -- times a day:    Blood Thinner/ Instructions /Last Dose: ASA / Instructions/ Last Dose :    81 mg aspirin

## 2024-01-27 ENCOUNTER — Encounter (HOSPITAL_COMMUNITY): Payer: Self-pay

## 2024-01-27 ENCOUNTER — Other Ambulatory Visit: Payer: Self-pay

## 2024-01-27 ENCOUNTER — Encounter (HOSPITAL_COMMUNITY)
Admission: RE | Admit: 2024-01-27 | Discharge: 2024-01-27 | Disposition: A | Discharge status: Home / Self Care | Source: Ambulatory Visit | Attending: Surgery | Admitting: Surgery

## 2024-01-27 DIAGNOSIS — I4519 Other right bundle-branch block: Secondary | ICD-10-CM | POA: Insufficient documentation

## 2024-01-27 DIAGNOSIS — Z01810 Encounter for preprocedural cardiovascular examination: Secondary | ICD-10-CM | POA: Diagnosis not present

## 2024-01-27 DIAGNOSIS — Z01812 Encounter for preprocedural laboratory examination: Secondary | ICD-10-CM | POA: Diagnosis not present

## 2024-01-27 DIAGNOSIS — Z01818 Encounter for other preprocedural examination: Secondary | ICD-10-CM | POA: Insufficient documentation

## 2024-01-27 LAB — COMPREHENSIVE METABOLIC PANEL WITH GFR
ALT: 25 U/L (ref 0–44)
AST: 25 U/L (ref 15–41)
Albumin: 4.2 g/dL (ref 3.5–5.0)
Alkaline Phosphatase: 66 U/L (ref 38–126)
Anion gap: 8 (ref 5–15)
BUN: 16 mg/dL (ref 6–20)
CO2: 26 mmol/L (ref 22–32)
Calcium: 9.1 mg/dL (ref 8.9–10.3)
Chloride: 103 mmol/L (ref 98–111)
Creatinine, Ser: 0.74 mg/dL (ref 0.61–1.24)
GFR, Estimated: >60 mL/min (ref >60)
Glucose, Bld: 112 mg/dL — ABNORMAL HIGH (ref 70–99)
Potassium: 3.9 mmol/L (ref 3.5–5.1)
Sodium: 137 mmol/L (ref 135–145)
Total Bilirubin: 1 mg/dL (ref 0.0–1.2)
Total Protein: 7.3 g/dL (ref 6.5–8.1)

## 2024-01-27 LAB — CBC WITH DIFFERENTIAL/PLATELET
Abs Immature Granulocytes: 0.01 K/uL (ref 0.00–0.07)
Basophils Absolute: 0 K/uL (ref 0.0–0.1)
Basophils Relative: 1 %
Eosinophils Absolute: 0.1 K/uL (ref 0.0–0.5)
Eosinophils Relative: 3 %
HCT: 42.2 % (ref 39.0–52.0)
Hemoglobin: 14 g/dL (ref 13.0–17.0)
Immature Granulocytes: 0 %
Lymphocytes Relative: 22 %
Lymphs Abs: 0.8 K/uL (ref 0.7–4.0)
MCH: 30.6 pg (ref 26.0–34.0)
MCHC: 33.2 g/dL (ref 30.0–36.0)
MCV: 92.1 fL (ref 80.0–100.0)
Monocytes Absolute: 0.4 K/uL (ref 0.1–1.0)
Monocytes Relative: 11 %
Neutro Abs: 2.4 K/uL (ref 1.7–7.7)
Neutrophils Relative %: 63 %
Platelets: 193 K/uL (ref 150–400)
RBC: 4.58 MIL/uL (ref 4.22–5.81)
RDW: 12.2 % (ref 11.5–15.5)
WBC: 3.8 K/uL — ABNORMAL LOW (ref 4.0–10.5)
nRBC: 0 % (ref 0.0–0.2)

## 2024-01-27 NOTE — Consult Note (Signed)
 WOC Nurse requested for preoperative stoma site marking  Discussed surgical procedure and stoma creation with patient. Explained role of the WOC nurse team.  Provided the patient with educational booklet and provided samples of pouching options.  Answered patient and family questions. Spent an extended period of time with the patient reviewed ostomy creation, pouching, care, length of need for ostomy, life with ostomy.   Did not mark for ostomies today, however I did explain rationale for marking for both ileostomy and colostomy.  He is planning a trip to the beach on Friday and will be in the ocean, surfing, shirt off and we decided to bring him in next week for marking closer to his surgery date 8/1. He is very appreciative of this offer.  We will have him come 02/03/24 @ 130pm, PAT staff are aware and they have entered an appt for this patient at that time. We will be in PAT for another visit this day and will mark Mr. Cosma at the same time Made PAT charge nurse aware, made PAT CMA aware, made WOC nursing team aware.   We will need to see him in surgical consultation room because PAT will not have a room to use.   WOC Nurse team will follow up with patient after surgery for continue ostomy care and teaching.  [facility] MSN, RN,CWOCN, CNS, [employer] [phone]

## 2024-02-03 ENCOUNTER — Encounter (HOSPITAL_COMMUNITY)
Admission: RE | Admit: 2024-02-03 | Discharge: 2024-02-03 | Disposition: A | Discharge status: Home / Self Care | Source: Ambulatory Visit | Attending: Surgery

## 2024-02-03 DIAGNOSIS — N400 Enlarged prostate without lower urinary tract symptoms: Secondary | ICD-10-CM | POA: Diagnosis not present

## 2024-02-03 DIAGNOSIS — Z808 Family history of malignant neoplasm of other organs or systems: Secondary | ICD-10-CM | POA: Diagnosis not present

## 2024-02-03 DIAGNOSIS — Z8249 Family history of ischemic heart disease and other diseases of the circulatory system: Secondary | ICD-10-CM | POA: Diagnosis not present

## 2024-02-03 DIAGNOSIS — K651 Peritoneal abscess: Secondary | ICD-10-CM | POA: Diagnosis not present

## 2024-02-03 DIAGNOSIS — Z01812 Encounter for preprocedural laboratory examination: Secondary | ICD-10-CM | POA: Insufficient documentation

## 2024-02-03 DIAGNOSIS — K660 Peritoneal adhesions (postprocedural) (postinfection): Secondary | ICD-10-CM | POA: Diagnosis not present

## 2024-02-03 DIAGNOSIS — E785 Hyperlipidemia, unspecified: Secondary | ICD-10-CM | POA: Diagnosis not present

## 2024-02-03 DIAGNOSIS — K5720 Diverticulitis of large intestine with perforation and abscess without bleeding: Secondary | ICD-10-CM | POA: Diagnosis not present

## 2024-02-03 DIAGNOSIS — K5730 Diverticulosis of large intestine without perforation or abscess without bleeding: Secondary | ICD-10-CM | POA: Diagnosis not present

## 2024-02-03 DIAGNOSIS — K5721 Diverticulitis of large intestine with perforation and abscess with bleeding: Secondary | ICD-10-CM | POA: Diagnosis not present

## 2024-02-03 DIAGNOSIS — Z408 Encounter for other prophylactic surgery: Secondary | ICD-10-CM | POA: Diagnosis not present

## 2024-02-03 DIAGNOSIS — Z880 Allergy status to penicillin: Secondary | ICD-10-CM | POA: Diagnosis not present

## 2024-02-03 DIAGNOSIS — F419 Anxiety disorder, unspecified: Secondary | ICD-10-CM | POA: Diagnosis not present

## 2024-02-03 NOTE — H&P (Signed)
 New pt -   1) right ureteral stone-patient developed right flank pain and abd pain. He underwent CT scan of the abdomen and pelvis 01/27/2024 which revealed an 8 to 9 mm right UPJ stone. No other stones. Stone was readily visible on the scout image and SSD was about 11 cm. His white count was 6.5 and creatinine 1.14. UA was normal.   Today, seen for the above. Doing well. Pain manageable. Staying hydrated.   No prior stones.     ALLERGIES: No Allergies    MEDICATIONS: metFORMIN HCl 1 PO Daily  Omeprazole 1 PO Daily  glipiZIDE 1 PO Daily  rOPINIRole HCl 1 PO Daily  Rosuvastatin Calcium 1 PO Daily  Vitamin D3 1 PO Daily     GU PSH: None   NON-GU PSH: None   GU PMH: None   NON-GU PMH: Diabetes Type 2 GERD    FAMILY HISTORY: None   SOCIAL HISTORY: Marital Status: Married Current Smoking Status: Patient smokes. Has smoked since 01/05/2004.   Tobacco Use Assessment Completed: Used Tobacco in last 30 days? Does not drink anymore.  Does not drink caffeine.    REVIEW OF SYSTEMS:    GU Review Male:   Patient reports frequent urination, hard to postpone urination, burning/ pain with urination, get up at night to urinate, stream starts and stops, and trouble starting your stream. Patient denies leakage of urine, have to strain to urinate , erection problems, and penile pain.  Gastrointestinal (Upper):   Patient reports nausea. Patient denies vomiting and indigestion/ heartburn.  Gastrointestinal (Lower):   Patient reports constipation. Patient denies diarrhea.  Constitutional:   Patient denies fever, night sweats, weight loss, and fatigue.  Skin:   Patient denies skin rash/ lesion and itching.  Eyes:   Patient denies blurred vision and double vision.  Ears/ Nose/ Throat:   Patient denies sore throat and sinus problems.  Hematologic/Lymphatic:   Patient denies swollen glands and easy bruising.  Cardiovascular:   Patient denies leg swelling and chest pains.  Respiratory:   Patient  denies cough and shortness of breath.  Endocrine:   Patient denies excessive thirst.  Musculoskeletal:   Patient denies back pain and joint pain.  Neurological:   Patient reports headaches. Patient denies dizziness.  Psychologic:   Patient denies depression and anxiety.   VITAL SIGNS:      01/29/2024 01:37 PM  Weight 147 lb / 66.68 kg  Height 67 in / 170.18 cm  BP 124/80 mmHg  Pulse 83 /min  Temperature 97.7 F / 36.5 C  BMI 23.0 kg/m   MULTI-SYSTEM PHYSICAL EXAMINATION:    Constitutional: Well-nourished. No physical deformities. Normally developed. Good grooming.  Neck: Neck symmetrical, not swollen. Normal tracheal position.  Respiratory: No labored breathing, no use of accessory muscles.   Cardiovascular: Normal temperature, normal extremity pulses, no swelling, no varicosities.  Lymphatic: No enlargement of neck, axillae, groin.  Skin: No paleness, no jaundice, no cyanosis. No lesion, no ulcer, no rash.  Neurologic / Psychiatric: Oriented to time, oriented to place, oriented to person. No depression, no anxiety, no agitation.  Gastrointestinal: No mass, no tenderness, no rigidity, non obese abdomen.  Eyes: Normal conjunctivae. Normal eyelids.  Ears, Nose, Mouth, and Throat: Left ear no scars, no lesions, no masses. Right ear no scars, no lesions, no masses. Nose no scars, no lesions, no masses. Normal hearing. Normal lips.  Musculoskeletal: Normal gait and station of head and neck.     Complexity of Data:  Records Review:  Previous Hospital Records  X-Ray Review: C.T. Abdomen/Pelvis: Reviewed Films. Discussed With Patient. 2025    PROCEDURES:          Urinalysis - 81003 Dipstick Dipstick Cont'd  Color: Yellow Bilirubin: Neg  Appearance: Clear Ketones: Neg  Specific Gravity: 1.015 Blood: Neg  pH: <=5.0 Protein: Neg  Glucose: Neg Urobilinogen: 0.2    Nitrites: Neg    Leukocyte Esterase: Neg    Notes:      ASSESSMENT:      ICD-10 Details  1 GU:   Ureteral  calculus - N20.1 Undiagnosed New Problem - Injury the patient and his wife a picture of the anatomy and we went over the nature risk benefits and alternatives to stone passage continuing off label alpha blockers, ureteroscopy with laser and stent possible staged procedure, shockwave lithotripsy possible staged procedure. All questions answered. He will proceed with shockwave lithotripsy but prefers to do it next Friday as he would like to work early next week.   PLAN:           Schedule Return Visit/Planned Activity: Next Available Appointment - Schedule Surgery          Document Letter(s):  Created for Patient: Clinical Summary         Next Appointment:      Next Appointment: 02/05/2024 11:15 AM    Appointment Type: Surgery     Location: [facility] [phone]    Provider: Norleen Seltzer, M.D.    Reason for Visit: OP--WL--RIGHT ESWL

## 2024-02-03 NOTE — Consult Note (Signed)
 WOC Nurse requested for preoperative stoma site marking  Discussed surgical procedure and stoma creation with patient.   Explained role of the WOC nurse team.  Provided the patient with educational booklet and provided samples of pouching options.  Answered patient questions.   Examined patient sitting and standing in order to place the marking in the patient's visual field, away from any creases or abdominal contour issues and within the rectus muscle.  Attempted to mark below the patient's belt line.   Marked for colostomy in the LLQ 6 cm to the left of the umbilicus and 5 cm above/below the umbilicus.  Marked for ileostomy in the RLQ 6 cm to the right of the umbilicus and 5 cm above/below the umbilicus.  Patient's abdomen cleansed with CHG wipes at site markings, allowed to air dry prior to marking. Covered mark with thin film transparent dressing to preserve mark until date of surgery.   WOC Nurse team will follow up with patient after surgery for continue ostomy care and teaching.   Thank-you,  Lela Holm BSN, CNS, RN, [employer], WOCN  (Phone [phone])

## 2024-02-04 MED ORDER — DEXAMETHASONE SODIUM PHOSPHATE 10 MG/ML IJ SOLN
INTRAMUSCULAR | Status: AC
  Filled 2024-02-04: qty 1

## 2024-02-04 MED ORDER — PROPOFOL 10 MG/ML IV BOLUS
INTRAVENOUS | Status: AC
  Filled 2024-02-04: qty 20

## 2024-02-04 MED ORDER — LIDOCAINE HCL (PF) 2 % IJ SOLN
INTRAMUSCULAR | Status: AC
  Filled 2024-02-04: qty 5

## 2024-02-04 MED ORDER — ONDANSETRON HCL 4 MG/2ML IJ SOLN
INTRAMUSCULAR | Status: AC
  Filled 2024-02-04: qty 2

## 2024-02-04 MED ORDER — MIDAZOLAM HCL 2 MG/2ML IJ SOLN
INTRAMUSCULAR | Status: AC
  Filled 2024-02-04: qty 2

## 2024-02-04 MED ORDER — FENTANYL CITRATE (PF) 100 MCG/2ML IJ SOLN
INTRAMUSCULAR | Status: AC
Start: 2024-02-04 — End: 2024-02-04
  Filled 2024-02-04: qty 2

## 2024-02-04 NOTE — Anesthesia Preprocedure Evaluation (Signed)
 Anesthesia Evaluation  Patient identified by MRN, date of birth, ID band Patient awake    Reviewed: Allergy & Precautions, NPO status , Patient's Chart, lab work & pertinent test results  Airway Mallampati: II  TM Distance: >3 FB Neck ROM: Full    Dental  (+) Teeth Intact, Dental Advisory Given   Pulmonary neg pulmonary ROS   Pulmonary exam normal breath sounds clear to auscultation       Cardiovascular negative cardio ROS Normal cardiovascular exam Rhythm:Regular Rate:Normal     Neuro/Psych  PSYCHIATRIC DISORDERS Anxiety     negative neurological ROS     GI/Hepatic Neg liver ROS,GERD  ,,Peritoneal abscess   Endo/Other  negative endocrine ROS    Renal/GU negative Renal ROS     Musculoskeletal negative musculoskeletal ROS (+)    Abdominal   Peds  Hematology negative hematology ROS (+)   Anesthesia Other Findings   Reproductive/Obstetrics                              Anesthesia Physical Anesthesia Plan  ASA: 2  Anesthesia Plan: General   Post-op Pain Management: Tylenol  PO (pre-op)*   Induction: Intravenous  PONV Risk Score and Plan: 3 and Midazolam , Dexamethasone  and Ondansetron   Airway Management Planned: Oral ETT  Additional Equipment:   Intra-op Plan:   Post-operative Plan: Extubation in OR  Informed Consent: I have reviewed the patients History and Physical, chart, labs and discussed the procedure including the risks, benefits and alternatives for the proposed anesthesia with the patient or authorized representative who has indicated his/her understanding and acceptance.     Dental advisory given  Plan Discussed with: CRNA  Anesthesia Plan Comments: (2nd PIV after induction)         Anesthesia Quick Evaluation

## 2024-02-05 ENCOUNTER — Encounter (HOSPITAL_COMMUNITY): Payer: Self-pay | Admitting: Surgery

## 2024-02-05 ENCOUNTER — Other Ambulatory Visit: Payer: Self-pay

## 2024-02-05 ENCOUNTER — Encounter (HOSPITAL_COMMUNITY)
Admission: RE | Disposition: A | Discharge status: Home / Self Care | Payer: Self-pay | Source: Home / Self Care | Attending: Surgery

## 2024-02-05 ENCOUNTER — Inpatient Hospital Stay (HOSPITAL_COMMUNITY)
Admission: RE | Admit: 2024-02-05 | Discharge: 2024-02-07 | DRG: 329 | Disposition: A | Discharge status: Home / Self Care | Attending: Surgery | Admitting: Surgery

## 2024-02-05 ENCOUNTER — Inpatient Hospital Stay (HOSPITAL_COMMUNITY): Payer: Self-pay | Admitting: Medical

## 2024-02-05 ENCOUNTER — Inpatient Hospital Stay (HOSPITAL_COMMUNITY): Payer: Self-pay | Admitting: Anesthesiology

## 2024-02-05 DIAGNOSIS — K660 Peritoneal adhesions (postprocedural) (postinfection): Secondary | ICD-10-CM | POA: Diagnosis present

## 2024-02-05 DIAGNOSIS — Z808 Family history of malignant neoplasm of other organs or systems: Secondary | ICD-10-CM | POA: Diagnosis not present

## 2024-02-05 DIAGNOSIS — E785 Hyperlipidemia, unspecified: Secondary | ICD-10-CM | POA: Diagnosis present

## 2024-02-05 DIAGNOSIS — K651 Peritoneal abscess: Secondary | ICD-10-CM | POA: Diagnosis present

## 2024-02-05 DIAGNOSIS — N400 Enlarged prostate without lower urinary tract symptoms: Secondary | ICD-10-CM | POA: Diagnosis present

## 2024-02-05 DIAGNOSIS — F419 Anxiety disorder, unspecified: Secondary | ICD-10-CM | POA: Diagnosis present

## 2024-02-05 DIAGNOSIS — Z8249 Family history of ischemic heart disease and other diseases of the circulatory system: Secondary | ICD-10-CM | POA: Diagnosis not present

## 2024-02-05 DIAGNOSIS — Z880 Allergy status to penicillin: Secondary | ICD-10-CM

## 2024-02-05 DIAGNOSIS — Z01812 Encounter for preprocedural laboratory examination: Secondary | ICD-10-CM | POA: Diagnosis not present

## 2024-02-05 DIAGNOSIS — K5720 Diverticulitis of large intestine with perforation and abscess without bleeding: Principal | ICD-10-CM | POA: Diagnosis present

## 2024-02-05 DIAGNOSIS — Z9049 Acquired absence of other specified parts of digestive tract: Principal | ICD-10-CM

## 2024-02-05 HISTORY — PX: XI ROBOTIC ASSISTED LOWER ANTERIOR RESECTION: SHX6558

## 2024-02-05 HISTORY — PX: CYSTOSCOPY WITH INDOCYANINE GREEN IMAGING (ICG): SHX7549

## 2024-02-05 HISTORY — PX: XI ROBOTIC ASSISTED SMALL BOWEL RESECTION: SHX6872

## 2024-02-05 HISTORY — PX: FLEXIBLE SIGMOIDOSCOPY: SHX5431

## 2024-02-05 LAB — TYPE AND SCREEN
ABO/RH(D): O POS
Antibody Screen: NEGATIVE

## 2024-02-05 SURGERY — RESECTION, RECTUM, LOW ANTERIOR, ROBOT-ASSISTED
Anesthesia: General

## 2024-02-05 MED ORDER — BUPIVACAINE-EPINEPHRINE (PF) 0.25% -1:200000 IJ SOLN
INTRAMUSCULAR | Status: DC | PRN
  Administered 2024-02-05: 50 mL

## 2024-02-05 MED ORDER — ONDANSETRON HCL 4 MG/2ML IJ SOLN: 4.0000 mg | Freq: Four times a day (QID) | INTRAMUSCULAR | Status: DC | PRN

## 2024-02-05 MED ORDER — POLYETHYLENE GLYCOL 3350 17 GM/SCOOP PO POWD: 238.0000 g | Freq: Once | ORAL | Status: DC

## 2024-02-05 MED ORDER — PHENYLEPHRINE 80 MCG/ML (10ML) SYRINGE FOR IV PUSH (FOR BLOOD PRESSURE SUPPORT)
PREFILLED_SYRINGE | INTRAVENOUS | Status: AC
  Filled 2024-02-05: qty 10

## 2024-02-05 MED ORDER — SODIUM CHLORIDE 0.9 % IV SOLN: 12.5000 mg/h | Freq: Once | INTRAVENOUS | Status: DC

## 2024-02-05 MED ORDER — HYDRALAZINE HCL 20 MG/ML IJ SOLN: 10.0000 mg | INTRAMUSCULAR | Status: DC | PRN

## 2024-02-05 MED ORDER — MEPERIDINE HCL 100 MG/ML IJ SOLN
12.5000 mg | Freq: Once | INTRAMUSCULAR | Status: DC
  Filled 2024-02-05: qty 1

## 2024-02-05 MED ORDER — DIPHENHYDRAMINE HCL 12.5 MG/5ML PO ELIX: 12.5000 mg | ORAL_SOLUTION | Freq: Four times a day (QID) | ORAL | Status: DC | PRN

## 2024-02-05 MED ORDER — MEPERIDINE HCL 50 MG/ML IJ SOLN
12.5000 mg | Freq: Once | INTRAMUSCULAR | Status: DC
  Filled 2024-02-05: qty 1

## 2024-02-05 MED ORDER — BUPIVACAINE-EPINEPHRINE (PF) 0.25% -1:200000 IJ SOLN
INTRAMUSCULAR | Status: AC
  Filled 2024-02-05: qty 60

## 2024-02-05 MED ORDER — TRAMADOL HCL 50 MG PO TABS
50.0000 mg | ORAL_TABLET | Freq: Four times a day (QID) | ORAL | Status: DC | PRN
  Administered 2024-02-05 – 2024-02-07 (×6): 50 mg via ORAL
  Filled 2024-02-05 (×6): qty 1

## 2024-02-05 MED ORDER — LACTATED RINGERS IV SOLN: INTRAVENOUS | Status: DC

## 2024-02-05 MED ORDER — DIPHENHYDRAMINE HCL 50 MG/ML IJ SOLN: 12.5000 mg | Freq: Four times a day (QID) | INTRAMUSCULAR | Status: DC | PRN

## 2024-02-05 MED ORDER — ACETAMINOPHEN 500 MG PO TABS
1000.0000 mg | ORAL_TABLET | Freq: Four times a day (QID) | ORAL | Status: DC
  Administered 2024-02-05 – 2024-02-06 (×7): 1000 mg via ORAL
  Filled 2024-02-05 (×8): qty 2

## 2024-02-05 MED ORDER — FENTANYL CITRATE (PF) 100 MCG/2ML IJ SOLN
INTRAMUSCULAR | Status: AC
Start: 2024-02-05 — End: 2024-02-05
  Filled 2024-02-05: qty 2

## 2024-02-05 MED ORDER — AMISULPRIDE (ANTIEMETIC) 5 MG/2ML IV SOLN: 10.0000 mg | Freq: Once | INTRAVENOUS | Status: DC | PRN

## 2024-02-05 MED ORDER — HYDROMORPHONE HCL 1 MG/ML IJ SOLN: 0.5000 mg | INTRAMUSCULAR | Status: DC | PRN

## 2024-02-05 MED ORDER — PHENAZOPYRIDINE HCL 100 MG PO TABS
100.0000 mg | ORAL_TABLET | Freq: Three times a day (TID) | ORAL | Status: DC
  Administered 2024-02-05 – 2024-02-06 (×3): 100 mg via ORAL
  Filled 2024-02-05 (×5): qty 1

## 2024-02-05 MED ORDER — SUGAMMADEX SODIUM 200 MG/2ML IV SOLN
INTRAVENOUS | Status: AC
  Filled 2024-02-05: qty 2

## 2024-02-05 MED ORDER — PHENAZOPYRIDINE HCL 100 MG PO TABS
100.0000 mg | ORAL_TABLET | Freq: Three times a day (TID) | ORAL | Status: DC
  Administered 2024-02-05 (×2): 100 mg via ORAL
  Filled 2024-02-05 (×2): qty 1

## 2024-02-05 MED ORDER — NEOMYCIN SULFATE 500 MG PO TABS: 1000.0000 mg | ORAL_TABLET | ORAL | Status: DC

## 2024-02-05 MED ORDER — KETAMINE HCL 10 MG/ML IJ SOLN
INTRAMUSCULAR | Status: DC | PRN
  Administered 2024-02-05: 30 mg via INTRAVENOUS
  Administered 2024-02-05: 20 mg via INTRAVENOUS

## 2024-02-05 MED ORDER — ONDANSETRON HCL 4 MG PO TABS: 4.0000 mg | ORAL_TABLET | Freq: Four times a day (QID) | ORAL | Status: DC | PRN

## 2024-02-05 MED ORDER — CHLORHEXIDINE GLUCONATE CLOTH 2 % EX PADS: 6.0000 | MEDICATED_PAD | Freq: Once | CUTANEOUS | Status: DC

## 2024-02-05 MED ORDER — ENSURE PRE-SURGERY PO LIQD
592.0000 mL | Freq: Once | ORAL | Status: DC
  Filled 2024-02-05: qty 592

## 2024-02-05 MED ORDER — ACETAMINOPHEN 500 MG PO TABS
1000.0000 mg | ORAL_TABLET | ORAL | Status: AC
  Administered 2024-02-05: 1000 mg via ORAL
  Filled 2024-02-05: qty 2

## 2024-02-05 MED ORDER — STERILE WATER FOR INJECTION IJ SOLN
INTRAMUSCULAR | Status: AC
  Filled 2024-02-05: qty 10

## 2024-02-05 MED ORDER — FENTANYL CITRATE (PF) 100 MCG/2ML IJ SOLN
INTRAMUSCULAR | Status: DC | PRN
  Administered 2024-02-05: 25 ug via INTRAVENOUS
  Administered 2024-02-05: 50 ug via INTRAVENOUS
  Administered 2024-02-05: 25 ug via INTRAVENOUS
  Administered 2024-02-05 (×2): 50 ug via INTRAVENOUS
  Administered 2024-02-05: 100 ug via INTRAVENOUS
  Administered 2024-02-05 (×2): 25 ug via INTRAVENOUS

## 2024-02-05 MED ORDER — MIDAZOLAM HCL 5 MG/5ML IJ SOLN
INTRAMUSCULAR | Status: DC | PRN
  Administered 2024-02-05: 2 mg via INTRAVENOUS

## 2024-02-05 MED ORDER — CHLORHEXIDINE GLUCONATE 0.12 % MT SOLN
15.0000 mL | Freq: Once | OROMUCOSAL | Status: AC
  Administered 2024-02-05: 15 mL via OROMUCOSAL

## 2024-02-05 MED ORDER — ENSURE SURGERY PO LIQD: 237.0000 mL | Freq: Two times a day (BID) | ORAL | Status: DC

## 2024-02-05 MED ORDER — ALVIMOPAN 12 MG PO CAPS
12.0000 mg | ORAL_CAPSULE | Freq: Two times a day (BID) | ORAL | Status: DC
  Administered 2024-02-06: 12 mg via ORAL
  Filled 2024-02-05: qty 1

## 2024-02-05 MED ORDER — 0.9 % SODIUM CHLORIDE (POUR BTL) OPTIME
TOPICAL | Status: DC | PRN
  Administered 2024-02-05: 2000 mL

## 2024-02-05 MED ORDER — ROCURONIUM BROMIDE 10 MG/ML (PF) SYRINGE
PREFILLED_SYRINGE | INTRAVENOUS | Status: DC | PRN
  Administered 2024-02-05: 60 mg via INTRAVENOUS
  Administered 2024-02-05: 10 mg via INTRAVENOUS
  Administered 2024-02-05: 20 mg via INTRAVENOUS

## 2024-02-05 MED ORDER — SIMETHICONE 80 MG PO CHEW
40.0000 mg | CHEWABLE_TABLET | Freq: Four times a day (QID) | ORAL | Status: DC | PRN
Start: 2024-02-05 — End: 2024-02-07

## 2024-02-05 MED ORDER — KETAMINE HCL 50 MG/5ML IJ SOSY
PREFILLED_SYRINGE | INTRAMUSCULAR | Status: AC
  Filled 2024-02-05: qty 5

## 2024-02-05 MED ORDER — STERILE WATER FOR IRRIGATION IR SOLN
Status: DC | PRN
Start: 2024-02-05 — End: 2024-02-05
  Administered 2024-02-05: 1000 mL

## 2024-02-05 MED ORDER — PROPOFOL 10 MG/ML IV BOLUS
INTRAVENOUS | Status: AC
  Filled 2024-02-05: qty 20

## 2024-02-05 MED ORDER — ALUM & MAG HYDROXIDE-SIMETH 200-200-20 MG/5ML PO SUSP: 30.0000 mL | Freq: Four times a day (QID) | ORAL | Status: DC | PRN

## 2024-02-05 MED ORDER — DEXAMETHASONE SODIUM PHOSPHATE 10 MG/ML IJ SOLN
INTRAMUSCULAR | Status: DC | PRN
  Administered 2024-02-05: 5 mg via INTRAVENOUS

## 2024-02-05 MED ORDER — TRAMADOL HCL 50 MG PO TABS
50.0000 mg | ORAL_TABLET | Freq: Four times a day (QID) | ORAL | 0 refills | Status: AC | PRN
Start: 2024-02-06 — End: 2024-02-11

## 2024-02-05 MED ORDER — SODIUM CHLORIDE 0.9 % IV SOLN
2.0000 g | INTRAVENOUS | Status: AC
  Administered 2024-02-05: 2 g via INTRAVENOUS
  Filled 2024-02-05: qty 2

## 2024-02-05 MED ORDER — ENSURE PRE-SURGERY PO LIQD: 296.0000 mL | Freq: Once | ORAL | Status: DC

## 2024-02-05 MED ORDER — SPY AGENT GREEN - (INDOCYANINE FOR INJECTION)
INTRAMUSCULAR | Status: DC | PRN
  Administered 2024-02-05: 20 mL via INTRAMUSCULAR

## 2024-02-05 MED ORDER — ONDANSETRON HCL 4 MG/2ML IJ SOLN
INTRAMUSCULAR | Status: AC
  Filled 2024-02-05: qty 2

## 2024-02-05 MED ORDER — SUGAMMADEX SODIUM 200 MG/2ML IV SOLN
INTRAVENOUS | Status: DC | PRN
  Administered 2024-02-05: 200 mg via INTRAVENOUS

## 2024-02-05 MED ORDER — SPY AGENT GREEN - (INDOCYANINE FOR INJECTION)
INTRAMUSCULAR | Status: DC | PRN
  Administered 2024-02-05: 1 mL via INTRAVENOUS

## 2024-02-05 MED ORDER — DEXAMETHASONE SODIUM PHOSPHATE 10 MG/ML IJ SOLN
INTRAMUSCULAR | Status: AC
  Filled 2024-02-05: qty 1

## 2024-02-05 MED ORDER — ROCURONIUM BROMIDE 10 MG/ML (PF) SYRINGE
PREFILLED_SYRINGE | INTRAVENOUS | Status: AC
  Filled 2024-02-05: qty 10

## 2024-02-05 MED ORDER — MIDAZOLAM HCL 2 MG/2ML IJ SOLN
INTRAMUSCULAR | Status: AC
  Filled 2024-02-05: qty 2

## 2024-02-05 MED ORDER — ONDANSETRON HCL 4 MG/2ML IJ SOLN
INTRAMUSCULAR | Status: DC | PRN
  Administered 2024-02-05: 4 mg via INTRAVENOUS

## 2024-02-05 MED ORDER — ALVIMOPAN 12 MG PO CAPS
12.0000 mg | ORAL_CAPSULE | ORAL | Status: AC
  Administered 2024-02-05: 12 mg via ORAL
  Filled 2024-02-05: qty 1

## 2024-02-05 MED ORDER — FENTANYL CITRATE PF 50 MCG/ML IJ SOSY
25.0000 ug | PREFILLED_SYRINGE | INTRAMUSCULAR | Status: DC | PRN
  Administered 2024-02-05: 50 ug via INTRAVENOUS

## 2024-02-05 MED ORDER — HEPARIN SODIUM (PORCINE) 5000 UNIT/ML IJ SOLN
5000.0000 [IU] | Freq: Once | INTRAMUSCULAR | Status: AC
  Administered 2024-02-05: 5000 [IU] via SUBCUTANEOUS
  Filled 2024-02-05: qty 1

## 2024-02-05 MED ORDER — LIDOCAINE HCL (PF) 2 % IJ SOLN
INTRAMUSCULAR | Status: DC | PRN
  Administered 2024-02-05: 1.5 mg/kg/h via INTRADERMAL

## 2024-02-05 MED ORDER — METRONIDAZOLE 500 MG PO TABS: 1000.0000 mg | ORAL_TABLET | ORAL | Status: DC

## 2024-02-05 MED ORDER — BISACODYL 5 MG PO TBEC: 20.0000 mg | DELAYED_RELEASE_TABLET | Freq: Once | ORAL | Status: DC

## 2024-02-05 MED ORDER — FENTANYL CITRATE PF 50 MCG/ML IJ SOSY
PREFILLED_SYRINGE | INTRAMUSCULAR | Status: AC
Start: 2024-02-05 — End: 2024-02-05
  Filled 2024-02-05: qty 2

## 2024-02-05 MED ORDER — PHENYLEPHRINE 80 MCG/ML (10ML) SYRINGE FOR IV PUSH (FOR BLOOD PRESSURE SUPPORT)
PREFILLED_SYRINGE | INTRAVENOUS | Status: DC | PRN
Start: 2024-02-05 — End: 2024-02-05
  Administered 2024-02-05 (×4): 80 ug via INTRAVENOUS

## 2024-02-05 MED ORDER — LACTATED RINGERS IV SOLN: INTRAVENOUS | Status: DC | PRN

## 2024-02-05 MED ORDER — PROPOFOL 10 MG/ML IV BOLUS
INTRAVENOUS | Status: DC | PRN
  Administered 2024-02-05: 170 mg via INTRAVENOUS

## 2024-02-05 MED ORDER — IBUPROFEN 400 MG PO TABS: 600.0000 mg | ORAL_TABLET | Freq: Four times a day (QID) | ORAL | Status: DC | PRN

## 2024-02-05 MED ORDER — FENTANYL CITRATE (PF) 250 MCG/5ML IJ SOLN
INTRAMUSCULAR | Status: AC
  Filled 2024-02-05: qty 5

## 2024-02-05 MED ORDER — ONDANSETRON HCL 4 MG/2ML IJ SOLN: 4.0000 mg | Freq: Once | INTRAMUSCULAR | Status: DC | PRN

## 2024-02-05 MED ORDER — HEPARIN SODIUM (PORCINE) 5000 UNIT/ML IJ SOLN
5000.0000 [IU] | Freq: Three times a day (TID) | INTRAMUSCULAR | Status: DC
  Administered 2024-02-05 – 2024-02-06 (×5): 5000 [IU] via SUBCUTANEOUS
  Filled 2024-02-05 (×6): qty 1

## 2024-02-05 MED ORDER — KETAMINE HCL 50 MG/5ML IJ SOSY
PREFILLED_SYRINGE | INTRAMUSCULAR | Status: DC | PRN
  Administered 2024-02-05: 30 mg via INTRAVENOUS

## 2024-02-05 MED ORDER — ORAL CARE MOUTH RINSE: 15.0000 mL | Freq: Once | OROMUCOSAL | Status: AC

## 2024-02-05 SURGICAL SUPPLY — 96 items
BAG COUNTER SPONGE SURGICOUNT (BAG) IMPLANT
BAG URO CATCHER STRL LF (MISCELLANEOUS) ×1 IMPLANT
BLADE EXTENDED COATED 6.5IN (ELECTRODE) ×1 IMPLANT
CANNULA REDUCER 12-8 DVNC XI (CANNULA) ×1 IMPLANT
CATH URETL OPEN 5X70 (CATHETERS) IMPLANT
CELLS DAT CNTRL 66122 CELL SVR (MISCELLANEOUS) IMPLANT
CHLORAPREP W/TINT 26 (MISCELLANEOUS) ×1 IMPLANT
CLIP APPLIE 5 13 M/L LIGAMAX5 (MISCELLANEOUS) IMPLANT
CLIP APPLIE ROT 10 11.4 M/L (STAPLE) IMPLANT
CLIP LIGATING HEM O LOK PURPLE (MISCELLANEOUS) IMPLANT
CLIP LIGATING HEMO O LOK GREEN (MISCELLANEOUS) IMPLANT
CLOTH BEACON ORANGE TIMEOUT ST (SAFETY) ×1 IMPLANT
COVER SURGICAL LIGHT HANDLE (MISCELLANEOUS) ×2 IMPLANT
COVER TIP SHEARS 8 DVNC (MISCELLANEOUS) ×1 IMPLANT
DEFOGGER SCOPE WARM SEASHARP (MISCELLANEOUS) ×2 IMPLANT
DERMABOND ADVANCED .7 DNX6 (GAUZE/BANDAGES/DRESSINGS) IMPLANT
DEVICE TROCAR PUNCTURE CLOSURE (ENDOMECHANICALS) IMPLANT
DRAIN CHANNEL 19F RND (DRAIN) ×1 IMPLANT
DRAPE ARM DVNC X/XI (DISPOSABLE) ×4 IMPLANT
DRAPE COLUMN DVNC XI (DISPOSABLE) ×1 IMPLANT
DRAPE CV SPLIT W-CLR ANES SCRN (DRAPES) ×1 IMPLANT
DRAPE PERI GROIN 82X75IN TIB (DRAPES) ×1 IMPLANT
DRAPE SURG IRRIG POUCH 19X23 (DRAPES) ×1 IMPLANT
DRIVER NDL LRG 8 DVNC XI (INSTRUMENTS) ×1 IMPLANT
DRIVER NDLE LRG 8 DVNC XI (INSTRUMENTS) ×1 IMPLANT
DRSG OPSITE POSTOP 4X10 (GAUZE/BANDAGES/DRESSINGS) IMPLANT
DRSG OPSITE POSTOP 4X6 (GAUZE/BANDAGES/DRESSINGS) IMPLANT
DRSG OPSITE POSTOP 4X8 (GAUZE/BANDAGES/DRESSINGS) IMPLANT
DRSG TEGADERM 2-3/8X2-3/4 SM (GAUZE/BANDAGES/DRESSINGS) ×5 IMPLANT
DRSG TEGADERM 4X4.75 (GAUZE/BANDAGES/DRESSINGS) ×1 IMPLANT
ELECT REM PT RETURN 15FT ADLT (MISCELLANEOUS) ×1 IMPLANT
ENDOLOOP SUT PDS II 0 18 (SUTURE) IMPLANT
EVACUATOR SILICONE 100CC (DRAIN) ×1 IMPLANT
GAUZE SPONGE 2X2 8PLY STRL LF (GAUZE/BANDAGES/DRESSINGS) ×1 IMPLANT
GAUZE SPONGE 4X4 12PLY STRL (GAUZE/BANDAGES/DRESSINGS) IMPLANT
GLOVE BIO SURGEON STRL SZ7.5 (GLOVE) ×3 IMPLANT
GLOVE INDICATOR 8.0 STRL GRN (GLOVE) ×3 IMPLANT
GLOVE SURG LX STRL 7.5 STRW (GLOVE) ×1 IMPLANT
GOWN SRG XL LVL 4 BRTHBL STRL (GOWNS) ×1 IMPLANT
GOWN STRL REUS W/ TWL XL LVL3 (GOWN DISPOSABLE) ×5 IMPLANT
GRASPER SUT TROCAR 14GX15 (MISCELLANEOUS) IMPLANT
GRASPER TIP-UP FEN DVNC XI (INSTRUMENTS) ×1 IMPLANT
GUIDEWIRE ANG ZIPWIRE 038X150 (WIRE) IMPLANT
GUIDEWIRE STR DUAL SENSOR (WIRE) IMPLANT
HOLDER FOLEY CATH W/STRAP (MISCELLANEOUS) ×1 IMPLANT
IRRIGATION SUCT STRKRFLW 2 WTP (MISCELLANEOUS) ×1 IMPLANT
KIT PROCEDURE DVNC SI (MISCELLANEOUS) ×1 IMPLANT
KIT TURNOVER KIT A (KITS) ×2 IMPLANT
MANIFOLD NEPTUNE II (INSTRUMENTS) ×1 IMPLANT
NDL INSUFFLATION 14GA 120MM (NEEDLE) ×1 IMPLANT
NEEDLE INSUFFLATION 14GA 120MM (NEEDLE) ×1 IMPLANT
PACK COLON (CUSTOM PROCEDURE TRAY) ×1 IMPLANT
PACK CYSTO (CUSTOM PROCEDURE TRAY) ×1 IMPLANT
PAD POSITIONING PINK XL (MISCELLANEOUS) ×1 IMPLANT
PENCIL SMOKE EVACUATOR (MISCELLANEOUS) IMPLANT
PROTECTOR NERVE ULNAR (MISCELLANEOUS) ×2 IMPLANT
RELOAD STAPLE 45 3.5 BLU DVNC (STAPLE) IMPLANT
RELOAD STAPLE 45 4.3 GRN DVNC (STAPLE) IMPLANT
RELOAD STAPLE 60 3.5 BLU DVNC (STAPLE) IMPLANT
RELOAD STAPLE 60 4.3 GRN DVNC (STAPLE) IMPLANT
RETRACTOR WND ALEXIS 18 MED (MISCELLANEOUS) IMPLANT
SCISSORS LAP 5X35 DISP (ENDOMECHANICALS) IMPLANT
SCISSORS MNPLR CVD DVNC XI (INSTRUMENTS) ×1 IMPLANT
SEAL UNIV 5-12 XI (MISCELLANEOUS) ×4 IMPLANT
SEALER VESSEL EXT DVNC XI (MISCELLANEOUS) ×1 IMPLANT
SLEEVE ADV FIXATION 5X100MM (TROCAR) IMPLANT
SOLUTION ELECTROSURG ANTI STCK (MISCELLANEOUS) ×1 IMPLANT
SPIKE FLUID TRANSFER (MISCELLANEOUS) ×1 IMPLANT
STAPLER 60 SUREFORM DVNC (STAPLE) IMPLANT
STAPLER ECHELON POWER CIR 29 (STAPLE) IMPLANT
STAPLER ECHELON POWER CIR 31 (STAPLE) IMPLANT
STOPCOCK 4 WAY LG BORE MALE ST (IV SETS) ×2 IMPLANT
SURGILUBE 2OZ TUBE FLIPTOP (MISCELLANEOUS) ×1 IMPLANT
SUT MNCRL AB 4-0 PS2 18 (SUTURE) ×1 IMPLANT
SUT PDS AB 1 CT1 27 (SUTURE) IMPLANT
SUT PDS AB 1 TP1 96 (SUTURE) IMPLANT
SUT PROLENE 0 CT 2 (SUTURE) IMPLANT
SUT PROLENE 2 0 KS (SUTURE) ×1 IMPLANT
SUT PROLENE 2 0 SH DA (SUTURE) IMPLANT
SUT SILK 2 0 SH CR/8 (SUTURE) IMPLANT
SUT SILK 2-0 18XBRD TIE 12 (SUTURE) IMPLANT
SUT SILK 3 0 SH CR/8 (SUTURE) ×1 IMPLANT
SUT SILK 3-0 18XBRD TIE 12 (SUTURE) ×1 IMPLANT
SUT VIC AB 3-0 SH 18 (SUTURE) IMPLANT
SUT VIC AB 3-0 SH 27XBRD (SUTURE) IMPLANT
SUT VICRYL 0 UR6 27IN ABS (SUTURE) ×1 IMPLANT
SUTURE V-LC BRB 180 2/0GR6GS22 (SUTURE) IMPLANT
SYR 10ML LL (SYRINGE) ×1 IMPLANT
SYSTEM LAPSCP GELPORT 120MM (MISCELLANEOUS) IMPLANT
SYSTEM WOUND ALEXIS 18CM MED (MISCELLANEOUS) ×1 IMPLANT
TAPE UMBILICAL 1/8 X36 TWILL (MISCELLANEOUS) ×1 IMPLANT
TRAY FOLEY MTR SLVR 16FR STAT (SET/KITS/TRAYS/PACK) ×1 IMPLANT
TROCAR ADV FIXATION 5X100MM (TROCAR) ×1 IMPLANT
TUBING CONNECTING 10 (TUBING) ×4 IMPLANT
TUBING INSUFFLATION 10FT LAP (TUBING) ×1 IMPLANT
TUBING UROLOGY SET (TUBING) IMPLANT

## 2024-02-05 NOTE — Progress Notes (Signed)
 Spoke with Dr Corinne about patients pre-op blood pressure, no orders recieved

## 2024-02-05 NOTE — Consult Note (Signed)
  I was asked to do cystoscopy with ureteral catheterization and firefly instillation prior to exploratory laparotomy by Dr. Teresa.  Jeric has no prior urologic history and his last urine was clear.  He has no voiding complaints.   I have reviewed his CT from April and his last UA.   He has some prostate enlargement but no other GU findings.    Imp: No contraindications to firefly instillation.  Plan:  I have reviewed the risks of the procedure including bleeding, infection and injury to the urinary tract.  He had no questions or concerns.

## 2024-02-05 NOTE — Op Note (Signed)
 Procedure: Cystoscopy with bilateral ureteral catheterization and instillation of firefly solution.  Preop diagnosis: Diverticular abscess.  Postop diagnosis: Same.  Surgeon: Dr. Norleen Seltzer.  Anesthesia: General.  Specimen: None.  Drains: Foley catheter.  EBL: None.  Complications: None.  Indications: The patient is a 53 year old male with a diverticular abscess who is to undergo definitive therapy by Dr. Quite.  Was for requested to instill firefly for ureteral visualization.  Procedure: He was taken the operating room where he was given antibiotics per general surgery.  A general anesthetic was induced.  He was placed in lithotomy position and fitted with PAS hose.  His perineum and genitalia were prepped with Betadine solution he was draped in usual sterile fashion.  Cystoscopy was performed using the 21 [place] scope and 30 degree lens.  Examination revealed a normal urethra.  The external sphincter was intact.  The prostatic urethra was approximately 2 cm in length with some lateral lobe hyperplasia with minimal coaptation.  Examination of bladder revealed a smooth wall without mucosal lesions.  Ureteral orifices were unremarkable.  A 5 French open-ended catheter was then passed into the left ureteral orifice and 7 mL of firefly solution was instilled without difficulty.  I then repeated that process on the right side once again without difficulty.  The cystoscope was removed and a 16 French Foley catheter was inserted.  The balloon was filled with 10 mL of sterile fluid and the cath was placed straight drainage.  There were no complications.

## 2024-02-05 NOTE — Transfer of Care (Signed)
 Immediate Anesthesia Transfer of Care Note  Patient: Alexander [name]  Procedure(s) Performed: RESECTION, RECTUM, LOW ANTERIOR, ROBOT-ASSISTED EXCISION, SMALL INTESTINE, ROBOT-ASSISTED, SMALL BOWEL SEROSAL REPAIR SIGMOIDOSCOPY, FLEXIBLE CYSTOSCOPY WITH INDOCYANINE GREEN IMAGING (ICG)  Patient Location: PACU  Anesthesia Type:General  Level of Consciousness: drowsy  Airway & Oxygen Therapy: Patient Spontanous Breathing and Patient connected to face mask  Post-op Assessment: Report given to RN and Post -op Vital signs reviewed and stable  Post vital signs: Reviewed and stable  Last Vitals:  Vitals Value Taken Time  BP 137/83 02/05/24 10:31  Temp    Pulse 71 02/05/24 10:33  Resp 10 02/05/24 10:33  SpO2 100 % 02/05/24 10:33  Vitals shown include unfiled device data.  Last Pain:  Vitals:   02/05/24 0547  TempSrc: Oral  PainSc: 0-No pain      Patients Stated Pain Goal: 4 (02/05/24 0547)  Complications: No notable events documented.

## 2024-02-05 NOTE — Op Note (Addendum)
 PATIENT: Alexander [name]  53 y.o. male  Patient Care Team: Shayne Anes, MD as PCP - General (Internal Medicine) Mona Vinie BROCKS, MD as PCP - Cardiology (Cardiology)  PREOP DIAGNOSIS: COMPLICATED DIVERTICULITIS  POSTOP DIAGNOSIS: COMPLICATED DIVERTICULITIS  PROCEDURE:  Robotic assisted low anterior resection with double stapled colorectal anastomosis Small bowel repair (primary serosal suture repair) Drainage of intra-abdominal abscess Robotic lysis of adhesions x 80 minutes Intraoperative assessment of perfusion using ICG fluorescence imaging Flexible sigmoidoscopy Bilateral transversus abdominus plane (TAP) blocks  SURGEON: Lonni HERO. Pinchos Topel, MD  ASSISTANT: Bernarda Ned, MD  An experienced assistant was required given the complexity of this procedure and the standard of surgical care. My assistant helped with exposure through counter tension, suctioning, ligation and retraction to better visualize the surgical field. My assistant expedited sewing during the case by following my sutures. Wherever I use the term we in the report, my assistant actively helped me with that portion of the procedure.   ANESTHESIA: General endotracheal  EBL: 50 mL Total I/O In: 1100 [I.V.:1000; IV Piggyback:100] Out: 220 [Urine:200; Blood:20]  DRAINS: none  SPECIMEN: rectosigmoid colon - open end proximal  COUNTS: Sponge, needle and instrument counts were reported correct x2  FINDINGS: Omental containing adhesions across the midline.  Small bowel adhesions in the pelvis that were abutting the sigmoid colon.  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  Associated chronic abscess cavity at this location as well.  Small bowel serosa was ultimately repaired where it was adherent to the abscess cavity.  There is no full-thickness perforation.  A well perfused, tension free, hemostatic, air tight 29 mm EEA colorectal anastomosis fashioned 15 cm from the  anal verge by flexible sigmoidoscopy.   NARRATIVE: Informed consent was verified. The patient was taken to the operating room, placed supine on the operating table and SCD's were applied. General endotracheal anesthesia was induced without difficulty.  He was then positioned in the lithotomy position with Allen stirrups.  Pressure points were evaluated and padded.  A foley catheter was then placed by nursing under sterile conditions. Hair on the abdomen was clipped.  He was secured to the operating table.  Dr. Watt with [facility] scrubbed for his portion of the procedure.  Please refer to his note for details.. The abdomen was then prepped and draped in the standard sterile fashion. Surgical timeout was called indicating the correct patient, procedure, positioning and need for preoperative antibiotics.   An OG tube was placed by anesthesia and confirmed to be to suction.  At Palmer's point, a stab incision was created and the Veress needle was introduced into the peritoneal cavity on the first attempt.  Intraperitoneal location was confirmed by the aspiration and saline drop test.  Pneumoperitoneum was established to a maximum pressure of 15 mmHg using CO2.  Following this, the abdomen was marked for planned trocar sites.  Just to the right and cephalad to the umbilicus, an 8 mm incision was created and an 8 mm blunt tipped robotic trocar was cautiously placed into the peritoneal cavity.  The laparoscope was inserted and demonstrated no evidence of trocar site nor Veress needle site complications.  The Veress needle was removed.  Bilateral transversus abdominis plane blocks were then created using a dilute mixture of Exparel with Marcaine.  3 additional 8 mm robotic trochars were placed under direct visualization roughly in a line extending from the right ASIS towards the left upper quadrant. The bladder was inspected and noted  to be at/below the pubic symphysis.  Staying 3 fingerbreadths above the  pubic symphysis, an incision was created and the 12 mm robotic trocar inserted directed cephalad into the peritoneal cavity under direct visualization.  An additional 5 mm assist port was placed in the right lateral abdomen under direct visualization.  The abdomen was surveyed and there was adhesions across the midline of omentum secondary to his prior surgery. Small bowel adhesions to the sigmoid colon in the pelvis.  He was positioned in Trendelenburg with the left side tilted slightly up.  Small bowel was carefully retracted out of the pelvis.  The robot was then docked and I went to the console.  We began with adhesiolysis. Adhesions consisting of omentum were taken down from the midline using hot scissors.  Hemostasis is verified.  Attention was directed to the pelvis.  There are 2 discrete loops of small bowel which are densely adherent to the sigmoid colon and sigmoid mesentery.  We carefully began with lysing adhesions of small bowel.  These were fairly dense and in mobilizing the small bowel off the sigmoid and sigmoid mesentery, we did encounter a chronic abscess cavity.  Upon entering the abdomen (organ space), I encountered purulence in the sigmoid abscess cavity.  CASE DATA:  Type of patient?: Elective [facility] Private Case  Status of Case? Elective Scheduled  Infection Present At Time Of Surgery (PATOS)?  PURULENCE at the sigmoid where there is an abscess cavity.  There is also a phlegmon at this location involving the sigmoid colon.   We evacuated approximately 10 cc of thin yellow purulent fluid.  Attachments of the sigmoid colon were taken down from the intersigmoid fossa.  The rectosigmoid colon was grasped and elevated anteriorly. Beginning with a medial to lateral approach, the peritoneum overlying the presacral space was carefully incised.  The right ureter was readily identified as well with the aid of ICG.  We are able to stay well medial to this.  We are also able to see the left  ureter with the aid of ICG/near-infrared light.  The TME plane was readily gained working in a plane between the fascia propria of the rectum and the presacral fascia.  Hypogastric nerves were seen going along the the presacral fascia and were protected free of injury.  Working more proximally, the mesorectum and sigmoid mesentery were carefully mobilized off of the peritoneum.  The left ureter was identified and protected free of injury.  The left gonadal vessels were identified and protected.  These were both swept down.  The superior hemorrhoidal and IMA pedicles were identified. Further mesocolon was mobilized proximally staying in this plane between the retroperitoneum proper and the mesocolon. Attention was then turned to the lateral portion of dissection.  The sigmoid colon was then retracted to the right.  The sigmoid colon was fully mobilized. The descending colon was mobilized by incising the Delane Stalling line of Toldt.  This was done all the way up to the level of the splenic flexure.  The associated mesocolon was also mobilized medially.  The left ureter again was confirmed to be well away from the vasculature which had been dissected medially.  The rectosigmoid colon was elevated anteriorly. The left ureter was re-identified. The IMA was clear of this. The IMA was then divided with the vessel sealer. The stump was inspected and noted to be completely hemostatic with a good seal.  The mesentery was divided out to the point of planned proximal division.  Working more distally, the  rectum was identified where the tinea had splayed and there were loss of appendices epiploica.  This also corresponded to a location overlying the sacral promontory.  Anatomically, this clearly represents the proximal rectum.  The mesentery out to this level was then cleared using the vessel sealer. The distal point of transection on the proximal rectum was identified.   A 60 mm green load robotic stapler was then placed through  the 12 mm port and introduced into the peritoneal cavity.  The rectum was divided with essentially a single firing of the stapler.  The left most corner was controlled with an additional load although this was approximately 3 to 4 mm of tissue.  The stump was intact and healthy in appearance.   Attention was turned to performing a perfusion test. ICG was administered by anesthesia and at the level of the cleared mesentery proximally, there was excellent uptake of the tracer.  The rectum was also well perfused in appearance.  There was a visible pulse in the mesentery out to the level of the cleared colon at the level of the proximal sigmoid/descending colon junction.  This colon is also supple and healthy in appearance without any thickening.  This reached into the pelvis without any difficulty and remained in that location without any tension. A locking grasper was then placed on the sigmoid staple line.   The associated phlegmon is all within the planned resection and there is no remaining abscess cavities in the abdomen.  The retroperitoneal tissues are clean.  We therefore felt it would be reasonable to proceed with a planned primary anastomosis without diverting loop ileostomy.  The abdomen was copiously irrigated at this point as well.  All irrigation returned clear.  Attention was turned to the extracorporeal portion of the procedure.  The robot was undocked.  I scrubbed back in.  Using the 12 mm trocar site, a Pfannenstiel incision was created and incorporated the fascial opening through the 12 mm port site.  The rectus fascia was incised and then elevated.  The rectus muscle was mobilized free of the overlying fascia.  The peritoneum was incised in the midline well above the location of the bladder.  An Alexis wound protector was placed.  Towels were placed around the field.   We first began by evaluating the small bowel.  This was passed through our wound protector.  There were 2 discrete loops  that were involved in this in the pelvis that were quite far apart from one another.  The entire small bowel was run twice all the way up to the bowel running to the ligament of Treitz and all the way down to the cecum.  The 2 focal areas that were involved were then meticulously inspected.  There is no evident full-thickness perforation.  There is a small deserosalization which is secondary/inherent to the nature of the procedure.  This was repaired using 3-0 Vicryl sutures in a Lembert fashion.  Contents were milked to and fro and passed freely.  There is no narrowing.  The repair is inspected and intact.  We then reevaluated both areas the second time to ensure there is no evident injury and none were found.  Contents were again milked and there is no extravasation.   The divided colon was passed through the wound protector.  The point of proximal division was identified and was again on a healthy segment of supple colon with a palpable pulse in the mesentery. This was pink in color.  A pursestring  device was applied.  A 2-0 Prolene on a Keith needle was passed.  The colon was divided and passed off with the open end being proximal.  EEA sizers were then introduced and a 29 mm EEA selected.  Belt loops consisting of 3-0 silk were placed around the pursestring suture line.  The anvil was placed and the pursestring tied.  A small amount of fat was cleared from the planned anastomosis and no diverticula were apparent within this.  This was placed back into the abdomen and a cap placed over the wound protector port site.  Pneumoperitoneum was reestablished.  I then went below to pass the stapler.  Dr. Debby remained above.  EEA sizers were cautiously introduced via the anus and advanced under direct visualization.  The stapler was passed and the spike deployed just anterior to the staple line.  The components were then mated.  Orientation was confirmed such that there is no twisting of the colon nor small bowel  underneath the mesenteric defect. Care was taken to ensure no other structures were incorporated within this either.  The stapler was then closed, held, and fired. This was then removed. The donuts were inspected and noted to be complete.  The colon proximal to the anastomosis was then gently occluded. The pelvis was filled with sterile irrigation. Under direct visualization, I passed a flexible sigmoidoscope.  The anastomosis was under water.  With good distention of the anastomosis there was no air leak. The anastomosis pink in appearance.  This is located at 15 cm from the anal verge by flexible sigmoidoscopy.  It is hemostatic. Additionally, looking from above, there is no tension on the colon or mesentery.  Sigmoidoscope was withdrawn.  Irrigation was evacuated from the pelvis.  The abdomen and pelvis are surveyed and noted to be completely hemostatic without any apparent injury.  Under direct visualization, all trochars are removed.  The Alexis wound protector was removed.  Gowns/gloves are changed and a fresh set of clean instruments utilized. Additional sterile drapes were placed around the field.   The Pfannenstiel peritoneum was closed with a running 2-0 Vicryl suture.  The rectus fascia was then closed using 2 running #1 PDS sutures.  The fascia was then palpated and noted to be completely closed.  Additional anesthetic was infiltrated at the Pfannenstiel site.  Sponge, needle, and instrument counts were reported correct x2. 4-0 Monocryl subcuticular suture was used to close the skin of all incision sites.  Dermabond was placed over all incisions.  A honeycomb dressing placed over the Pfannenstiel as well.   He was then taken out of lithotomy, awakened from anesthesia, extubated, and transferred to a stretcher for transport to PACU in satisfactory condition having tolerated the procedure well.

## 2024-02-05 NOTE — Anesthesia Postprocedure Evaluation (Signed)
 Anesthesia Post Note  Patient: Alexander [name]  Procedure(s) Performed: RESECTION, RECTUM, LOW ANTERIOR, ROBOT-ASSISTED EXCISION, SMALL INTESTINE, ROBOT-ASSISTED, SMALL BOWEL SEROSAL REPAIR SIGMOIDOSCOPY, FLEXIBLE CYSTOSCOPY WITH INDOCYANINE GREEN IMAGING (ICG)     Patient location during evaluation: PACU Anesthesia Type: General Level of consciousness: awake and alert Pain management: pain level controlled Vital Signs Assessment: post-procedure vital signs reviewed and stable Respiratory status: spontaneous breathing, nonlabored ventilation and respiratory function stable Cardiovascular status: blood pressure returned to baseline and stable Postop Assessment: no apparent nausea or vomiting Anesthetic complications: no   No notable events documented.  Last Vitals:  Vitals:   02/05/24 1332 02/05/24 1430  BP: (!) 155/88 (!) 152/80  Pulse: 80 85  Resp: 16 18  Temp: 37 C 37 C  SpO2: 100% 100%    Last Pain:  Vitals:   02/05/24 1620  TempSrc:   PainSc: 6                  Garnette FORBES Skillern

## 2024-02-05 NOTE — Discharge Instructions (Signed)

## 2024-02-05 NOTE — H&P (Signed)
 CC: Here today for surgery  HPI: Alexander [name] is an 53 y.o. male with history of HLD, whom is seen in the office today to establish colorectal surgery care.  He was taken the operating by one of our partners, Dr. Eletha, 08/2023 where he underwent ex lap with drainage of external abscess. He had presented to the ED at that time with 4-day history of abdominal pain in the mid abdomen. He was found to have interdomal abscess 3 loops of small bowel he was taken to the OR by Dr. Eletha. Noted some diverticulosis. He found no obvious small bowel pathology per se. He noted an inflammatory mass in the lower abdomen and upper pelvis. There was concern that this could originate from his sigmoid colon but was not completely clear at that time. He ultimately washed things out and left a drain.  He recovered. Drain was initially serosanguineous and fairly high output but output came down nicely over the ensuing 2 days. He was discharged 08/16/2023. Drain was removed. He was readmitted the hospital 09/22/2023. This was after a follow-up outpatient CT was completed. He had a CT scan completed 09/22/2023 which showed mild to moderate intraperitoneal free air. Increased wall thickening along the sigmoid colon and small bowel loops in the left lower quadrant. Felt to represent perforated sigmoid diverticulitis.  CT-guided drain placement 09/23/2023 with IR.  Follow-up CT 10/05/2023: 1. Similarly positioned, percutaneous pigtail drainage catheter in the sigmoid mesocolon anteriorly with interval resolution of the mixed gas and fluid collection. No persistent pneumoperitoneum. 2. Mild prostatomegaly.  CT A/P 10/21/23 1. Stable position of the left lower abdominal percutaneous drain. No surrounding abscess. 2. Slightly decreased stranding and inflammatory changes in the left lower quadrant. 3. No new fluid or abscess collections. 4. Aortic atherosclerosis.  Drain study 10/21/23 1. No residual abscess  collection and no evidence for a bowel fistula. 2. Percutaneous drain was removed.  Colonoscopy 12/03/23 -Hemorrhoids - Congested erythematous, atrophic and scarred mucosa in the sigmoid beginning at the second sigmoid turn to include the majority of the distal sigmoid roughly 12 cm. - Examined ileum normal - Exam otherwise normal  Returns to see me in follow-up to establish colorectal surgical care. He has been doing reasonably well. He has finally recovered from everything. He has got back to the activities enjoys. He is here today with his wife. He denies any abdominal pain, nausea, vomiting, fever/chills.   He denies any changes in health or health history since we met in the office. No new medications/allergies. He states he is ready for surgery today.SABRA  PMH: HLD  PSH: As above; right and left inguinal hernia surgeries remotely.  FHx: Denies any known family history of colorectal, breast, endometrial or ovarian cancer  Social Hx: Denies use of tobacco/EtOH/illicit drug. He is here today with his wife. He owns his own company. He also enjoys working on cars.   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[facility])    Hyperlipidemia     Past Surgical History:  Procedure Laterality Date   deviated septal surgery  07/08/1995   HERNIA REPAIR  07/07/1997   IR RADIOLOGIST EVAL & MGMT  10/21/2023   LAPAROTOMY N/A 08/11/2023   Procedure: EXPLORATORY LAPAROTOMY, DRAINAGE OF PELVIC ABSCESS;  Surgeon: Eletha Boas, MD;  Location: [facility];  Service: General;  Laterality: N/A;    Family History  Problem  Relation Age of Onset   Sudden death Father        sudeen cardiac death in 69s   Cancer Mother        abdominal ca   Heart attack Maternal Grandfather     Social:  reports that he has never smoked. He has never used smokeless tobacco. He reports that he does not drink alcohol and does not use  drugs.  Allergies:  Allergies  Allergen Reactions   Penicillins Other (See Comments)    He isn't sure of the reaction. Had reaction as a child    Medications: I have reviewed the patient's current medications.  No results found for this or any previous visit (from the past 48 hours).  No results found.   PE Blood pressure (!) 155/108, pulse 73, temperature 97.9 F (36.6 C), temperature source Oral, resp. rate 18, height 6' 2 (1.88 m), weight 76.7 kg, SpO2 98%. Constitutional: NAD; conversant Eyes: Moist conjunctiva; no lid lag; anicteric Lungs: Normal respiratory effort CV: RRR GI: Abd soft, ND; no palpable hepatosplenomegaly Psychiatric: Appropriate affect  No results found for this or any previous visit (from the past 48 hours).  No results found.  A/P: Alexander [name] is an 53 y.o. male with hx of HLD here for evaluation of what we suspect most likely be complicated diverticulitis of the sigmoid colon  Colonoscopy 12/03/23 clear aside from sigmoid findings noted above felt to be secondary to diverticulitis in the past  -The anatomy and physiology of the GI tract was reviewed with the patient. The pathophysiology of diverticulitis was discussed as well with associated pictures.  - We spent time discussing the potential role for surgery versus further observation. We discussed that with regards to surgery, it is feeling a thinner than as the lowest risk for recurrent complicated diverticulitis. In his particular case given the fairly protracted and complicated course, seems reasonable to consider surgery. We discussed robotic assisted versus open low anterior resection; probable small bowel resection; scenarios where an ostomy could be necessary. Cystoscopy/ureteral ICG ([facility])  -The planned procedure, material risks (including, but not limited to, pain, bleeding, infection, scarring, need for blood transfusion, damage to surrounding structures- blood  vessels/nerves/viscus/organs, damage to ureter, urine leak, leak from anastomosis, need for additional procedures, low anterior resection syndrome (LARS) = increased fecal urgency and/or frequency, scenarios where a stoma may be necessary and where it may be permanent, worsening of pre-existing medical conditions, chronic diarrhea, constipation secondary to narcotic use, hernia, recurrence, pneumonia, heart attack, stroke, death) benefits and alternatives to surgery were discussed at length. The patient's questions were answered to his satisfaction, he voiced understanding and elected to proceed with surgery. Additionally, we discussed typical postoperative expectations and the recovery process.  Lonni Pizza, MD [facility], [facility]

## 2024-02-05 NOTE — Anesthesia Procedure Notes (Signed)
 Procedure Name: Intubation Date/Time: 02/05/2024 7:37 AM  Performed by: Judythe Tanda Aran, CRNAPre-anesthesia Checklist: Patient identified, Emergency Drugs available, Suction available and Patient being monitored Patient Re-evaluated:Patient Re-evaluated prior to induction Oxygen Delivery Method: Circle system utilized Preoxygenation: Pre-oxygenation with 100% oxygen Induction Type: IV induction Ventilation: Mask ventilation without difficulty Laryngoscope Size: 2 and Miller Grade View: Grade I Tube type: Oral Tube size: 7.5 mm Number of attempts: 1 Airway Equipment and Method: Stylet Placement Confirmation: ETT inserted through vocal cords under direct vision, positive ETCO2 and breath sounds checked- equal and bilateral Secured at: 22 cm Tube secured with: Tape Dental Injury: Teeth and Oropharynx as per pre-operative assessment

## 2024-02-05 NOTE — Plan of Care (Signed)
  Problem: Education: Goal: Understanding of discharge needs will improve Outcome: Progressing Goal: Verbalization of understanding of the causes of altered bowel function will improve Outcome: Progressing

## 2024-02-06 ENCOUNTER — Encounter (HOSPITAL_COMMUNITY): Payer: Self-pay | Admitting: Surgery

## 2024-02-06 LAB — BASIC METABOLIC PANEL WITH GFR
Anion gap: 9 (ref 5–15)
BUN: 8 mg/dL (ref 6–20)
CO2: 25 mmol/L (ref 22–32)
Calcium: 8.7 mg/dL — ABNORMAL LOW (ref 8.9–10.3)
Chloride: 102 mmol/L (ref 98–111)
Creatinine, Ser: 0.79 mg/dL (ref 0.61–1.24)
GFR, Estimated: >60 mL/min (ref >60)
Glucose, Bld: 123 mg/dL — ABNORMAL HIGH (ref 70–99)
Potassium: 4.2 mmol/L (ref 3.5–5.1)
Sodium: 136 mmol/L (ref 135–145)

## 2024-02-06 LAB — CBC
HCT: 37.6 % — ABNORMAL LOW (ref 39.0–52.0)
Hemoglobin: 12.3 g/dL — ABNORMAL LOW (ref 13.0–17.0)
MCH: 30.1 pg (ref 26.0–34.0)
MCHC: 32.7 g/dL (ref 30.0–36.0)
MCV: 92.2 fL (ref 80.0–100.0)
Platelets: 172 K/uL (ref 150–400)
RBC: 4.08 MIL/uL — ABNORMAL LOW (ref 4.22–5.81)
RDW: 12.6 % (ref 11.5–15.5)
WBC: 11.6 K/uL — ABNORMAL HIGH (ref 4.0–10.5)
nRBC: 0 % (ref 0.0–0.2)

## 2024-02-06 NOTE — Plan of Care (Signed)
  Problem: Bowel/Gastric: Goal: Gastrointestinal status for postoperative course will improve Outcome: Progressing   

## 2024-02-06 NOTE — Progress Notes (Signed)
 1 Day Post-Op   Subjective/Chief Complaint: Patient had a bloody bowel movement otherwise feels good.  No further bleeding noted.   Objective: Vital signs in last 24 hours: Temp:  [97.3 F (36.3 C)-98.7 F (37.1 C)] 97.9 F (36.6 C) (08/02 1001) Pulse Rate:  [66-95] 78 (08/02 1001) Resp:  [10-20] 20 (08/02 1001) BP: (128-165)/(80-98) 145/89 (08/02 1001) SpO2:  [98 %-100 %] 99 % (08/02 1001) Weight:  [77.9 kg] 77.9 kg (08/02 0500) Last BM Date : 02/05/24  Intake/Output from previous day: 08/01 0701 - 08/02 0700 In: 3952.6 [P.O.:690; I.V.:3162.6; IV Piggyback:100] Out: 3320 [Urine:3300; Blood:20] Intake/Output this shift: No intake/output data recorded.  Abdomen: Incisions clean dry intact.  Nondistended.  Appropriately sore.  Lab Results:  Recent Labs    02/06/24 0536  WBC 11.6*  HGB 12.3*  HCT 37.6*  PLT 172   BMET Recent Labs    02/06/24 0536  NA 136  K 4.2  CL 102  CO2 25  GLUCOSE 123*  BUN 8  CREATININE 0.79  CALCIUM 8.7*   PT/INR No results for input(s): LABPROT, INR in the last 72 hours. ABG No results for input(s): PHART, HCO3 in the last 72 hours.  Invalid input(s): PCO2, PO2  Studies/Results: No results found.  Anti-infectives: Anti-infectives (From admission, onward)    Start     Dose/Rate Route Frequency Ordered Stop   02/05/24 1400  neomycin  (MYCIFRADIN ) tablet 1,000 mg  Status:  Discontinued       Placed in And Linked Group   1,000 mg Oral 3 times per day 02/05/24 0528 02/05/24 0532   02/05/24 1400  metroNIDAZOLE  (FLAGYL ) tablet 1,000 mg  Status:  Discontinued       Placed in And Linked Group   1,000 mg Oral 3 times per day 02/05/24 0528 02/05/24 0532   02/05/24 0600  cefoTEtan  (CEFOTAN ) 2 g in sodium chloride  0.9 % 100 mL IVPB        2 g 200 mL/hr over 30 Minutes Intravenous On call to O.R. 02/05/24 0528 02/05/24 1818       Assessment/Plan: s/p Procedure(s) with comments: RESECTION, RECTUM, LOW ANTERIOR,  ROBOT-ASSISTED (N/A) - ROBOTIC LOW ANTERIOR RESECTION EXCISION, SMALL INTESTINE, ROBOT-ASSISTED, SMALL BOWEL SEROSAL REPAIR (N/A) - POSSIBLE SMALL BOWEL RESECTION SIGMOIDOSCOPY, FLEXIBLE (N/A) CYSTOSCOPY WITH INDOCYANINE GREEN  IMAGING (ICG) (N/A) Advance diet  Ambulate  Monitor bleeding  Possible discharge Sunday.  LOS: 1 day    Debby LABOR Asja Frommer 02/06/2024

## 2024-02-07 LAB — CBC
HCT: 36.7 % — ABNORMAL LOW (ref 39.0–52.0)
Hemoglobin: 12.1 g/dL — ABNORMAL LOW (ref 13.0–17.0)
MCH: 31.1 pg (ref 26.0–34.0)
MCHC: 33 g/dL (ref 30.0–36.0)
MCV: 94.3 fL (ref 80.0–100.0)
Platelets: 157 K/uL (ref 150–400)
RBC: 3.89 MIL/uL — ABNORMAL LOW (ref 4.22–5.81)
RDW: 12.8 % (ref 11.5–15.5)
WBC: 6.8 K/uL (ref 4.0–10.5)
nRBC: 0 % (ref 0.0–0.2)

## 2024-02-07 LAB — BASIC METABOLIC PANEL WITH GFR
Anion gap: 9 (ref 5–15)
BUN: 9 mg/dL (ref 6–20)
CO2: 29 mmol/L (ref 22–32)
Calcium: 9 mg/dL (ref 8.9–10.3)
Chloride: 101 mmol/L (ref 98–111)
Creatinine, Ser: 0.88 mg/dL (ref 0.61–1.24)
GFR, Estimated: >60 mL/min (ref >60)
Glucose, Bld: 101 mg/dL — ABNORMAL HIGH (ref 70–99)
Potassium: 4.4 mmol/L (ref 3.5–5.1)
Sodium: 139 mmol/L (ref 135–145)

## 2024-02-07 NOTE — Discharge Summary (Signed)
 Physician Discharge Summary  Patient ID: Alexander [name] MRN: [id number] DOB/AGE: 54/02/1971 54 y.o.  Admit date: 02/05/2024 Discharge date: 02/07/2024  Admission Diagnoses: Complicated diverticulitis  Discharge Diagnoses:  Principal Problem:   S/P laparoscopic-assisted sigmoidectomy   Discharged Condition: good  Hospital Course: Patient with robotic assisted sigmoid colectomy.  Did well.  His bowel function returned postop day 1.  Tolerating his diet with good pain control.  He was discharged on postoperative day 2.      Treatments: surgery: Robotic sigmoid colectomy  Discharge Exam: Blood pressure 137/88, pulse 71, temperature 98.3 F (36.8 C), temperature source Oral, resp. rate 14, height 6' 2 (1.88 m), weight 77.5 kg, SpO2 99%. General appearance: alert and cooperative Resp: clear to auscultation bilaterally Cardio: nsr Incision/Wound:CDI   Disposition: Discharge disposition: 01-Home or Self Care       Discharge Instructions     Diet - low sodium heart healthy   Complete by: As directed    Increase activity slowly   Complete by: As directed       Allergies as of 02/07/2024       Reactions   Penicillins Other (See Comments)   He isn't sure of the reaction. Had reaction as a child        Medication List     STOP taking these medications    methocarbamol  500 MG tablet Commonly known as: ROBAXIN    Normal Saline Flush 0.9 % Soln   oxyCODONE  5 MG immediate release tablet Commonly known as: Oxy IR/ROXICODONE        TAKE these medications    acetaminophen  500 MG tablet Commonly known as: TYLENOL  Take 2 tablets (1,000 mg total) by mouth every 8 (eight) hours as needed.   aspirin EC 81 MG tablet Take 81 mg by mouth daily.   atorvastatin 40 MG tablet Commonly known as: LIPITOR Take 40 mg by mouth at bedtime.   CoQ10 200 MG Caps Take 200 mg by mouth daily.   docusate sodium  100 MG capsule Commonly known as: COLACE Take 1 capsule (100 mg  total) by mouth 2 (two) times daily as needed for mild constipation.   Ensure Max Protein Liqd Take 330 mLs (11 oz total) by mouth daily. What changed: Another medication with the same name was removed. Continue taking this medication, and follow the directions you see here.   Repatha  SureClick 140 MG/ML Soaj Generic drug: Evolocumab  ADMINISTER 1 ML UNDER THE SKIN EVERY 14 DAYS   traMADol  50 MG tablet Commonly known as: Ultram  Take 1 tablet (50 mg total) by mouth every 6 (six) hours as needed for up to 5 days (postop pain not controlled with tylenol  and ibuprofen  first).        Follow-up Information     Teresa Lonni HERO, MD Follow up on 02/22/2024.   Specialties: General Surgery, Colon and Rectal Surgery Why: Please arrive by 8:40 am Contact information: [address] [place] [address] [phone]                 Signed: Debby LABOR Amiree No 02/07/2024, 9:35 AM

## 2024-02-07 NOTE — Plan of Care (Signed)
 Pt is calm and cooperative, pleasant affect. He c/o 3/10 abdomen pain which is being treated with tramadol  prn and schedule tylenol . The medication interventions are effective for pain. Pt is able to ambulate and void independently. Slept through the night. No other complaints. Wife is bedside and supportive. Will continue to monitor.

## 2024-02-07 NOTE — Progress Notes (Signed)
 Reviewed written d/c instructions w pt and all questions answered, he verbalized understanding. D/C via w/c w all belongings in stable condition.

## 2024-02-07 NOTE — Progress Notes (Signed)
 Pharmacy Brief Note - Alvimopan  (Entereg )  The standing order set for alvimopan  (Entereg ) now includes an automatic order to discontinue the drug after the patient has had a bowel movement. The change was approved by the Pharmacy & Therapeutics Committee and the Medical Executive Committee.   This patient has had bowel movements documented by nursing. Therefore, alvimopan  has been discontinued. If there are questions, please contact the pharmacy at [phone].   Thank you-   Iantha Batch, PharmD, BCPS 02/07/2024 10:26 AM

## 2024-02-07 NOTE — Plan of Care (Signed)

## 2024-02-09 ENCOUNTER — Ambulatory Visit: Payer: Self-pay | Admitting: Surgery

## 2024-02-09 LAB — SURGICAL PATHOLOGY

## 2024-03-16 ENCOUNTER — Encounter: Payer: Self-pay | Admitting: Internal Medicine

## 2024-04-28 ENCOUNTER — Other Ambulatory Visit: Payer: Self-pay

## 2024-04-28 ENCOUNTER — Emergency Department (HOSPITAL_BASED_OUTPATIENT_CLINIC_OR_DEPARTMENT_OTHER): Admission: EM | Admit: 2024-04-28 | Discharge: 2024-04-28 | Disposition: A | Discharge status: Home / Self Care

## 2024-04-28 ENCOUNTER — Encounter (HOSPITAL_BASED_OUTPATIENT_CLINIC_OR_DEPARTMENT_OTHER): Payer: Self-pay | Admitting: Emergency Medicine

## 2024-04-28 ENCOUNTER — Emergency Department (HOSPITAL_BASED_OUTPATIENT_CLINIC_OR_DEPARTMENT_OTHER)

## 2024-04-28 DIAGNOSIS — R1012 Left upper quadrant pain: Secondary | ICD-10-CM | POA: Insufficient documentation

## 2024-04-28 DIAGNOSIS — Z7982 Long term (current) use of aspirin: Secondary | ICD-10-CM | POA: Diagnosis not present

## 2024-04-28 DIAGNOSIS — N3289 Other specified disorders of bladder: Secondary | ICD-10-CM | POA: Diagnosis not present

## 2024-04-28 DIAGNOSIS — R109 Unspecified abdominal pain: Secondary | ICD-10-CM

## 2024-04-28 DIAGNOSIS — R1010 Upper abdominal pain, unspecified: Secondary | ICD-10-CM | POA: Diagnosis not present

## 2024-04-28 LAB — CBC
HCT: 40 % (ref 39.0–52.0)
Hemoglobin: 14.1 g/dL (ref 13.0–17.0)
MCH: 32 pg (ref 26.0–34.0)
MCHC: 35.3 g/dL (ref 30.0–36.0)
MCV: 90.7 fL (ref 80.0–100.0)
Platelets: 204 K/uL (ref 150–400)
RBC: 4.41 MIL/uL (ref 4.22–5.81)
RDW: 11.9 % (ref 11.5–15.5)
WBC: 9.8 K/uL (ref 4.0–10.5)
nRBC: 0 % (ref 0.0–0.2)

## 2024-04-28 LAB — COMPREHENSIVE METABOLIC PANEL WITH GFR
ALT: 84 U/L — ABNORMAL HIGH (ref 0–44)
AST: 113 U/L — ABNORMAL HIGH (ref 15–41)
Albumin: 4.7 g/dL (ref 3.5–5.0)
Alkaline Phosphatase: 82 U/L (ref 38–126)
Anion gap: 12 (ref 5–15)
BUN: 15 mg/dL (ref 6–20)
CO2: 26 mmol/L (ref 22–32)
Calcium: 9.6 mg/dL (ref 8.9–10.3)
Chloride: 101 mmol/L (ref 98–111)
Creatinine, Ser: 0.91 mg/dL (ref 0.61–1.24)
GFR, Estimated: >60 mL/min (ref >60)
Glucose, Bld: 128 mg/dL — ABNORMAL HIGH (ref 70–99)
Potassium: 4.1 mmol/L (ref 3.5–5.1)
Sodium: 140 mmol/L (ref 135–145)
Total Bilirubin: 1.1 mg/dL (ref 0.0–1.2)
Total Protein: 7.4 g/dL (ref 6.5–8.1)

## 2024-04-28 LAB — LIPASE, BLOOD: Lipase: 26 U/L (ref 11–51)

## 2024-04-28 MED ORDER — HYDROMORPHONE HCL 1 MG/ML IJ SOLN
0.5000 mg | Freq: Once | INTRAMUSCULAR | Status: AC
  Administered 2024-04-28: 0.5 mg via INTRAVENOUS
  Filled 2024-04-28: qty 1

## 2024-04-28 MED ORDER — IOHEXOL 300 MG/ML  SOLN
100.0000 mL | Freq: Once | INTRAMUSCULAR | Status: AC | PRN
  Administered 2024-04-28: 100 mL via INTRAVENOUS

## 2024-04-28 NOTE — ED Triage Notes (Addendum)
 LUQ pain post lunch , progressively got worse through the day .  Denies NV.  Bowel resection  9 weeks ago .  No further symptoms .

## 2024-04-28 NOTE — ED Provider Notes (Signed)
 [place] [facility] AT [facility] Provider Note   CSN: [id number] Arrival date & time: 04/28/24  [id number]     Patient presents with: Abdominal Pain   Alexander [name] is a 53 y.o. male.   53 year old male presents for evaluation of abdominal pain.  He had recent bowel resection surgery about 6 weeks ago.  States has been going well until today he developed some abdominal pain after eating lunch today.  States it was a 9 out of 10 in severity but has become more of a 4-5 out of 10.  States has gotten better but felt like a cramping and tightening sensation.  Denies any nausea vomiting or diarrhea.  Denies any other symptoms or concerns at this time.   Abdominal Pain Associated symptoms: no chest pain, no chills, no cough, no dysuria, no fever, no hematuria, no shortness of breath, no sore throat and no vomiting        Prior to Admission medications   Medication Sig Start Date End Date Taking? Authorizing Provider  acetaminophen  (TYLENOL ) 500 MG tablet Take 2 tablets (1,000 mg total) by mouth every 8 (eight) hours as needed. 09/25/23   Maczis, Michael M, PA-C  aspirin EC 81 MG tablet Take 81 mg by mouth daily.    [provider]  atorvastatin (LIPITOR) 40 MG tablet Take 40 mg by mouth at bedtime. 04/08/13   [provider]  Coenzyme Q10 (COQ10) 200 MG CAPS Take 200 mg by mouth daily.    [provider]  docusate sodium  (COLACE) 100 MG capsule Take 1 capsule (100 mg total) by mouth 2 (two) times daily as needed for mild constipation. 09/25/23   Maczis, Michael M, PA-C  Ensure Max Protein (ENSURE MAX PROTEIN) LIQD Take 330 mLs (11 oz total) by mouth daily. 09/26/23   Maczis, Michael M, PA-C  REPATHA  SURECLICK 140 MG/ML SOAJ ADMINISTER 1 ML UNDER THE SKIN EVERY 14 DAYS 09/01/23   Hilty, Vinie BROCKS, MD    Allergies: Penicillins    Review of Systems  Constitutional:  Negative for chills and fever.  HENT:  Negative for ear pain and sore throat.    Eyes:  Negative for pain and visual disturbance.  Respiratory:  Negative for cough and shortness of breath.   Cardiovascular:  Negative for chest pain and palpitations.  Gastrointestinal:  Positive for abdominal pain. Negative for vomiting.  Genitourinary:  Negative for dysuria and hematuria.  Musculoskeletal:  Negative for arthralgias and back pain.  Skin:  Negative for color change and rash.  Neurological:  Negative for seizures and syncope.  All other systems reviewed and are negative.   Updated Vital Signs BP (!) 156/93 (BP Location: Right Arm)   Pulse 74   Temp 98.2 F (36.8 C)   Resp 18   Wt 78 kg   SpO2 100%   BMI 22.08 kg/m   Physical Exam Vitals and nursing note reviewed.  Constitutional:      General: He is not in acute distress.    Appearance: He is well-developed.  HENT:     Head: Normocephalic and atraumatic.  Eyes:     Conjunctiva/sclera: Conjunctivae normal.  Cardiovascular:     Rate and Rhythm: Normal rate and regular rhythm.     Heart sounds: No murmur heard. Pulmonary:     Effort: Pulmonary effort is normal. No respiratory distress.     Breath sounds: Normal breath sounds.  Abdominal:     General: Abdomen is flat.  Palpations: Abdomen is soft.     Tenderness: There is no abdominal tenderness.     Comments: Mild guarding, there is a well-healed midline incision  Musculoskeletal:        General: No swelling.     Cervical back: Neck supple.  Skin:    General: Skin is warm and dry.     Capillary Refill: Capillary refill takes less than 2 seconds.  Neurological:     Mental Status: He is alert.  Psychiatric:        Mood and Affect: Mood normal.     (all labs ordered are listed, but only abnormal results are displayed) Labs Reviewed  COMPREHENSIVE METABOLIC PANEL WITH GFR - Abnormal; Notable for the following components:      Result Value   Glucose, Bld 128 (*)    AST 113 (*)    ALT 84 (*)    All other components within normal limits   LIPASE, BLOOD  CBC    EKG: None  Radiology: CT ABDOMEN PELVIS W CONTRAST Result Date: 04/28/2024 CLINICAL DATA:  Upper abdominal pain, history of recent bowel resection EXAM: CT ABDOMEN AND PELVIS WITH CONTRAST TECHNIQUE: Multidetector CT imaging of the abdomen and pelvis was performed using the standard protocol following bolus administration of intravenous contrast. RADIATION DOSE REDUCTION: This exam was performed according to the departmental dose-optimization program which includes automated exposure control, adjustment of the mA and/or kV according to patient size and/or use of iterative reconstruction technique. CONTRAST:  OMNIPAQUE  IOHEXOL  300 MG/ML  SOLN COMPARISON:  10/21/2023 FINDINGS: Lower chest: No acute abnormality is noted. Persistent small 4 mm nodule is noted in the right lung base stable dating back to 2019 consistent with a benign etiology. No other focal abnormality is noted. Hepatobiliary: No focal liver abnormality is seen. No gallstones, gallbladder wall thickening, or biliary dilatation. Pancreas: Unremarkable. No pancreatic ductal dilatation or surrounding inflammatory changes. Spleen: Normal in size without focal abnormality. Adrenals/Urinary Tract: Adrenal glands are within normal limits. Kidneys demonstrate a normal enhancement pattern. No renal calculi or obstructive changes are noted. Normal excretion seen on delayed images. The bladder is partially distended. Stomach/Bowel: Postsurgical changes are noted in the pelvis consistent with the given clinical history of low anterior resection with primary reanastomosis. No colonic obstructive changes are seen. Scattered fecal material is noted throughout the colon. The appendix is within normal limits. No inflammatory changes are seen. Small bowel and stomach appear within normal limits. Vascular/Lymphatic: Aortic atherosclerosis. No enlarged abdominal or pelvic lymph nodes. Reproductive: Prostate is unremarkable. Other: No  abdominal wall hernia or abnormality. No abdominopelvic ascites. Musculoskeletal: No acute or significant osseous findings. IMPRESSION: Chronic changes without acute abnormality. Electronically Signed   By: Oneil Devonshire M.D.   On: 04/28/2024 22:21     Procedures   Medications Ordered in the ED  HYDROmorphone  (DILAUDID ) injection 0.5 mg (0.5 mg Intravenous Given 04/28/24 2132)  iohexol  (OMNIPAQUE ) 300 MG/ML solution 100 mL (100 mLs Intravenous Contrast Given 04/28/24 2158)                                    Medical Decision Making Patient here for abdominal pain that is improving.  He recently had a bowel resection surgery about 6 weeks ago.  Labs are unremarkable and CT scan shows no acute abnormality.  He is feeling better.  Discussed results with him and he will plan to follow-up with his surgeon  as needed and return to the ER for any worsening symptoms.  All results and plan discussed with patient wife at bedside.  Problems Addressed: Abdominal pain, non-surgical: acute illness or injury  Amount and/or Complexity of Data Reviewed External Data Reviewed: notes.    Details: Previous records reviewed and patient with recent bowel resection 2 to 6 weeks ago for abscess Labs: ordered. Decision-making details documented in ED Course.    Details: Ordered and reviewed by me and unremarkable Radiology: ordered and independent interpretation performed. Decision-making details documented in ED Course.    Details: Ordered and reviewed by me CT abdomen pelvis shows no acute abnormality in the abdomen  Risk OTC drugs. Prescription drug management. Parenteral controlled substances. Drug therapy requiring intensive monitoring for toxicity.    Final diagnoses:  Abdominal pain, non-surgical    ED Discharge Orders     None          Gennaro Duwaine CROME, DO 04/28/24 2329

## 2024-04-28 NOTE — Discharge Instructions (Addendum)
 Follow-up with your surgeon and primary care doctor as needed.  Return to the ER for new or worsening symptoms.

## 2024-05-25 DIAGNOSIS — R1013 Epigastric pain: Secondary | ICD-10-CM | POA: Diagnosis not present
